# Patient Record
Sex: Female | Born: 1935 | Race: White | Hispanic: No | Smoking: Former smoker
Health system: Southern US, Community
[De-identification: ages and names within clinical notes are randomized; demographics above are authoritative.]

## PROBLEM LIST (undated history)

## (undated) DIAGNOSIS — M1990 Unspecified osteoarthritis, unspecified site: Secondary | ICD-10-CM

## (undated) DIAGNOSIS — I350 Nonrheumatic aortic (valve) stenosis: Secondary | ICD-10-CM

## (undated) DIAGNOSIS — G309 Alzheimer's disease, unspecified: Secondary | ICD-10-CM

## (undated) DIAGNOSIS — I10 Essential (primary) hypertension: Secondary | ICD-10-CM

## (undated) DIAGNOSIS — F0390 Unspecified dementia without behavioral disturbance: Secondary | ICD-10-CM

## (undated) DIAGNOSIS — E785 Hyperlipidemia, unspecified: Secondary | ICD-10-CM

## (undated) DIAGNOSIS — F0280 Dementia in other diseases classified elsewhere without behavioral disturbance: Secondary | ICD-10-CM

## (undated) DIAGNOSIS — E039 Hypothyroidism, unspecified: Secondary | ICD-10-CM

## (undated) HISTORY — DX: Nonrheumatic aortic (valve) stenosis: I35.0

## (undated) HISTORY — DX: Essential (primary) hypertension: I10

## (undated) HISTORY — PX: TOTAL ABDOMINAL HYSTERECTOMY: SHX209

## (undated) HISTORY — DX: Hypothyroidism, unspecified: E03.9

## (undated) HISTORY — DX: Unspecified osteoarthritis, unspecified site: M19.90

## (undated) HISTORY — DX: Hyperlipidemia, unspecified: E78.5

## (undated) HISTORY — PX: CYST EXCISION: SHX5701

---

## 1997-04-02 HISTORY — PX: AORTIC VALVE REPLACEMENT: SHX41

## 1999-11-16 ENCOUNTER — Encounter: Payer: Self-pay | Admitting: Internal Medicine

## 2006-07-02 ENCOUNTER — Inpatient Hospital Stay (HOSPITAL_BASED_OUTPATIENT_CLINIC_OR_DEPARTMENT_OTHER): Admission: RE | Admit: 2006-07-02 | Discharge: 2006-07-02 | Payer: Self-pay | Admitting: *Deleted

## 2006-07-08 ENCOUNTER — Ambulatory Visit: Payer: Self-pay | Admitting: Surgery

## 2006-07-09 ENCOUNTER — Ambulatory Visit: Payer: Self-pay | Admitting: Surgery

## 2006-07-19 ENCOUNTER — Encounter (INDEPENDENT_AMBULATORY_CARE_PROVIDER_SITE_OTHER): Payer: Self-pay | Admitting: Specialist

## 2006-07-19 ENCOUNTER — Ambulatory Visit: Payer: Self-pay | Admitting: Thoracic Surgery

## 2006-07-19 ENCOUNTER — Inpatient Hospital Stay (HOSPITAL_COMMUNITY): Admission: RE | Admit: 2006-07-19 | Discharge: 2006-07-24 | Payer: Self-pay | Admitting: Surgery

## 2006-08-20 ENCOUNTER — Ambulatory Visit: Payer: Self-pay | Admitting: Surgery

## 2009-05-25 ENCOUNTER — Encounter: Payer: Self-pay | Admitting: Internal Medicine

## 2009-05-25 ENCOUNTER — Encounter (INDEPENDENT_AMBULATORY_CARE_PROVIDER_SITE_OTHER): Payer: Self-pay | Admitting: *Deleted

## 2009-06-20 ENCOUNTER — Ambulatory Visit: Payer: Self-pay | Admitting: Internal Medicine

## 2009-06-20 DIAGNOSIS — K5900 Constipation, unspecified: Secondary | ICD-10-CM | POA: Insufficient documentation

## 2009-06-27 ENCOUNTER — Ambulatory Visit: Payer: Self-pay | Admitting: Internal Medicine

## 2009-07-01 ENCOUNTER — Encounter (INDEPENDENT_AMBULATORY_CARE_PROVIDER_SITE_OTHER): Payer: Self-pay | Admitting: *Deleted

## 2009-07-01 LAB — CONVERTED CEMR LAB: Fecal Occult Bld: NEGATIVE

## 2009-07-25 ENCOUNTER — Encounter (INDEPENDENT_AMBULATORY_CARE_PROVIDER_SITE_OTHER): Payer: Self-pay | Admitting: *Deleted

## 2009-07-26 ENCOUNTER — Ambulatory Visit: Payer: Self-pay | Admitting: Internal Medicine

## 2009-08-02 ENCOUNTER — Ambulatory Visit: Payer: Self-pay | Admitting: Internal Medicine

## 2009-08-05 ENCOUNTER — Encounter: Payer: Self-pay | Admitting: Internal Medicine

## 2010-05-02 NOTE — Letter (Signed)
Summary: Patient Notice- Colon Biospy Results  [facility]  [address] [place], [place] [address]   Phone: [phone]  Fax: [phone]        Aug 05, 2009 MRN: [id number]    [facility] [address] [place], [place]  [address]    Dear Ms. Alcalde,  I am pleased to inform you that the biopsies taken during your recent colonoscopy did not show any evidence of cancer or pre-cancerous changes. The suspected polyp was actually a mound of normal colon tissue.  No further action is needed at this time.  Please follow-up with      your primary care physician for your other healthcare needs.  Please call us if you are having persistent problems or have questions about your condition that have not been fully answered at this time.   Sincerely,  Iva Boop MD, [facility]   This letter has been electronically signed by your physician. cc: Buren Kos, MD  Appended Document: Patient Notice- Colon Biospy Results letter mailed 5.10.11

## 2010-05-02 NOTE — Letter (Signed)
Summary: [facility]: Immunoassay Fecal Occult Blood (iFOB) Order [facility]  [address] [place], [place] [address]   Phone: [phone]  Fax: [phone]      [facility]: Immunoassay Fecal Occult Blood (iFOB) Order Form   June 20, 2009 MRN: [id number]   [facility] [place] 01/29/1924   Physicican Name:  Leone Payor  Diagnosis Code:  V76.51      Francee Piccolo CMA [facility])

## 2010-05-02 NOTE — Procedures (Signed)
Summary: Colonoscopy  Patient: [name] Note: All result statuses are Final unless otherwise noted.  Tests: (1) Colonoscopy (COL)   COL Colonoscopy           DONE     [facility]     [address]     [place], [place]  [address]           COLONOSCOPY PROCEDURE REPORT           PATIENT:  Tara, Lyons  MR#:  [id number]     BIRTHDATE:  Feb 11, 1924, 85 yrs. old  GENDER:  female     ENDOSCOPIST:  Iva Boop, MD, [facility]     REF. BY:  Kari Baars, M.D.     PROCEDURE DATE:  08/02/2009     PROCEDURE:  Colonoscopy with biopsy     ASA CLASS:  Class II     INDICATIONS:  Routine Risk Screening     MEDICATIONS:   Fentanyl 50 mcg IV, Versed 4 mg IV           DESCRIPTION OF PROCEDURE:   After the risks benefits and     alternatives of the procedure were thoroughly explained, informed     consent was obtained.  Digital rectal exam was performed and     revealed no abnormalities.   The LB [id number]-[id number] endoscope     was introduced through the anus and advanced to the cecum, which     was identified by both the appendix and ileocecal valve, without     limitations.  The quality of the prep was good, using MoviPrep.     The instrument was then slowly withdrawn as the colon was fully     examined. insertion: 4:08 minutes withdrawal: 7:35 minutes     <<PROCEDUREIMAGES>>           FINDINGS:  A diminutive polyp was found in the cecum. The polyp     was removed using cold biopsy forceps.  Severe diverticulosis was     found in the sigmoid colon.  This was otherwise a normal     examination of the colon.   Retroflexed views in the rectum     revealed no abnormalities.    The scope was then withdrawn from     the patient and the procedure completed.           COMPLICATIONS:  None     ENDOSCOPIC IMPRESSION:     1) Diminutive polyp in the cecum - removed     2) Severe diverticulosis in the sigmoid colon     3) Otherwise normal examination           REPEAT EXAM:  In for as  needed. (At her age and these findings     would not anticipate routine repeat exams)           Iva Boop, MD, [facility]           CC:  Kari Baars, MD     The Patient           n.     eSIGNED:   Iva Boop at 08/02/2009 11:08 AM           [place], [place], [id number]  Note: An exclamation mark (!) indicates a result that was not dispersed into the flowsheet. Document Creation Date: 08/02/2009 11:09 AM _______________________________________________________________________  (1) Order result status: Final Collection or observation date-time: 08/02/2009 10:54 Requested date-time:  Receipt  date-time:  Reported date-time:  Referring Physician:   Ordering Physician: Stan Head [phone]) Specimen Source:  Source: Launa Grill Order Number: [id number] Lab site:   Appended Document: Colonoscopy: diverticulosis   Colonoscopy  Procedure date:  08/03/2009  Findings:      1) Diminutive polyp in the cecum - removed BENIGN COLONIC MUCOSA     2) Severe diverticulosis in the sigmoid colon     3) Otherwise normal examination  Comments:      No routine follow-up/recall.

## 2010-05-02 NOTE — Letter (Signed)
Summary: [facility]  [facility]   Imported By: Sherian Rein 06/28/2009 10:02:04  _____________________________________________________________________  External Attachment:    Type:   Image     Comment:   External Document

## 2010-05-02 NOTE — Letter (Signed)
Summary: [facility] Instructions  [facility]  [address] [place], [place] [address]   Phone: [phone]  Fax: [phone]       [name]    May 27, 1923    MRN: [id number]        Procedure Day /Date:  Tuesday May 3,2011     Arrival Time: 9:00 a.m.     Procedure Time: 10:00 a.m.     Location of Procedure:                    _x_  [facility] (4th Floor)                        PREPARATION FOR COLONOSCOPY WITH MOVIPREP   Starting 5 days prior to your procedure 07/29/09 do not eat nuts, seeds, popcorn, corn, beans, peas,  salads, or any raw vegetables.  Do not take any fiber supplements (e.g. Metamucil, Citrucel, and Benefiber).  THE DAY BEFORE YOUR PROCEDURE         DATE:  08/01/09  DAY: Monday  1.  Drink clear liquids the entire day-NO SOLID FOOD  2.  Do not drink anything colored red or purple.  Avoid juices with pulp.  No orange juice.  3.  Drink at least 64 oz. (8 glasses) of fluid/clear liquids during the day to prevent dehydration and help the prep work efficiently.  CLEAR LIQUIDS INCLUDE: Water Jello Ice Popsicles Tea (sugar ok, no milk/cream) Powdered fruit flavored drinks Coffee (sugar ok, no milk/cream) Gatorade Juice: apple, white grape, white cranberry  Lemonade Clear bullion, consomm, broth Carbonated beverages (any kind) Strained chicken noodle soup Hard Candy                             4.  In the morning, mix first dose of MoviPrep solution:    Empty 1 Pouch A and 1 Pouch B into the disposable container    Add lukewarm drinking water to the top line of the container. Mix to dissolve    Refrigerate (mixed solution should be used within 24 hrs)  5.  Begin drinking the prep at 5:00 p.m. The MoviPrep container is divided by 4 marks.   Every 15 minutes drink the solution down to the next mark (approximately 8 oz) until the full liter is complete.   6.  Follow completed prep with 16 oz of clear liquid of your choice  (Nothing red or purple).  Continue to drink clear liquids until bedtime.  7.  Before going to bed, mix second dose of MoviPrep solution:    Empty 1 Pouch A and 1 Pouch B into the disposable container    Add lukewarm drinking water to the top line of the container. Mix to dissolve    Refrigerate  THE DAY OF YOUR PROCEDURE      DATE:  08/02/09 DAY: Tuesday  Beginning at  5:00 a.m.(5 hours before procedure):         1. Every 15 minutes, drink the solution down to the next mark (approx 8 oz) until the full liter is complete.  2. Follow completed prep with 16 oz. of clear liquid of your choice.    3. You may drink clear liquids until 8:00 a.m.  (2 HOURS BEFORE PROCEDURE).   MEDICATION INSTRUCTIONS  Unless otherwise instructed, you should take regular prescription medications with a small sip of water   as early as  possible the morning of your procedure.           OTHER INSTRUCTIONS  You will need a responsible adult at least 76 years of age to accompany you and drive you home.   This person must remain in the waiting room during your procedure.  Wear loose fitting clothing that is easily removed.  Leave jewelry and other valuables at home.  However, you may wish to bring a book to read or  an iPod/MP3 player to listen to music as you wait for your procedure to start.  Remove all body piercing jewelry and leave at home.  Total time from sign-in until discharge is approximately 2-3 hours.  You should go home directly after your procedure and rest.  You can resume normal activities the  day after your procedure.  The day of your procedure you should not:   Drive   Make legal decisions   Operate machinery   Drink alcohol   Return to work  You will receive specific instructions about eating, activities and medications before you leave.    The above instructions have been reviewed and explained to me by   Clide Cliff, RN_______________________    I fully  understand and can verbalize these instructions _____________________________ Date _________

## 2010-05-02 NOTE — Procedures (Signed)
Summary: Colonoscopy/[facility]  Colonoscopy/[facility]   Imported By: Sherian Rein 06/28/2009 10:06:35  _____________________________________________________________________  External Attachment:    Type:   Image     Comment:   External Document

## 2010-05-02 NOTE — Letter (Signed)
Summary: Previsit letter  [facility]  [address] [place], [place] [address]   Phone: [phone]  Fax: [phone]       07/01/2009 MRN: [id number]  [facility] [address] [place], [place]  [address]  Dear Ms. Hipwell,  Welcome to the Gastroenterology Division at [facility].    You are scheduled to see a nurse for your pre-procedure visit on  07-26-09 at 11am,  on the 3rd floor at [facility], [address]  We ask that you try to arrive at our office 15 minutes prior to your appointment time to allow for check-in.  Your nurse visit will consist of discussing your medical and surgical history, your immediate family medical history, and your medications.    Please bring a complete list of all your medications or, if you prefer, bring the medication bottles and we will list them.  We will need to be aware of both prescribed and over the counter drugs.  We will need to know exact dosage information as well.  If you are on blood thinners (Coumadin, Plavix, Aggrenox, Ticlid, etc.) please call our office today/prior to your appointment, as we need to consult with your physician about holding your medication.   Please be prepared to read and sign documents such as consent forms, a financial agreement, and acknowledgement forms.  If necessary, and with your consent, a friend or relative is welcome to sit-in on the nurse visit with you.  Please bring your insurance card so that we may make a copy of it.  If your insurance requires a referral to see a specialist, please bring your referral form from your primary care physician.  No co-pay is required for this nurse visit.     If you cannot keep your appointment, please call [phone] to cancel or reschedule prior to your appointment date.  This allows [place] the opportunity to schedule an appointment for another patient in need of care.    Thank you for choosing [facility] for your medical needs.   We appreciate the opportunity to care for you.  Please visit [place] at our website  to learn more about our practice.                     Sincerely.                                                                                                                   The Gastroenterology Division   (Your Colonoscopy is on 08-02-09 at 10am )  Appended Document: Previsit letter Letter mailed to patient.

## 2010-05-02 NOTE — Letter (Signed)
Summary: New Patient letter  [facility]  [address] [place], [place] [address]   Phone: [phone]  Fax: [phone]       05/25/2009 MRN: [id number]  [facility] [address] [place], [place]  [address]  Dear [name],  Welcome to the Gastroenterology Division at [facility].    You are scheduled to see Dr. Stan Head on June 20, 2009 at 10:30am on the 3rd floor at [facility], [address]  We ask that you try to arrive at our office 15 minutes prior to your appointment time to allow for check-in.  We would like you to complete the enclosed self-administered evaluation form prior to your visit and bring it with you on the day of your appointment.  We will review it with you.  Also, please bring a complete list of all your medications or, if you prefer, bring the medication bottles and we will list them.  Please bring your insurance card so that we may make a copy of it.  If your insurance requires a referral to see a specialist, please bring your referral form from your primary care physician.  Co-payments are due at the time of your visit and may be paid by cash, check or credit card.     Your office visit will consist of a consult with your physician (includes a physical exam), any laboratory testing he/she may order, scheduling of any necessary diagnostic testing (e.g. x-ray, ultrasound, CT-scan), and scheduling of a procedure (e.g. Endoscopy, Colonoscopy) if required.  Please allow enough time on your schedule to allow for any/all of these possibilities.    If you cannot keep your appointment, please call [phone] to cancel or reschedule prior to your appointment date.  This allows [place] the opportunity to schedule an appointment for another patient in need of care.  If you do not cancel or reschedule by 5 p.m. the business day prior to your appointment date, you will be charged a $50.00 late cancellation/no-show fee.    Thank you for  choosing [facility] for your medical needs.  We appreciate the opportunity to care for you.  Please visit [place] at our website  to learn more about our practice.                     Sincerely,                                                             The Gastroenterology Division

## 2010-05-02 NOTE — Miscellaneous (Signed)
Summary: COLON LEC 08-02-09 10AM  HX.POLYPS,CONSTIPATION         DR.GESS...  Clinical Lists Changes  Medications: Added new medication of MOVIPREP 100 GM  SOLR (PEG-KCL-NACL-NASULF-NA ASC-C) As directed - Signed Rx of MOVIPREP 100 GM  SOLR (PEG-KCL-NACL-NASULF-NA ASC-C) As directed;  #1 x 0;  Signed;  Entered by: Clide Cliff RN;  Authorized by: Iva Boop MD, FACG;  Method used: [facility] to [facility]  [address] [place], [place]  [address], Ph: [phone] or [phone], Fax: [phone] Observations: Added new observation of ALLERGY REV: Done (07/26/2009 10:46)    Prescriptions: MOVIPREP 100 GM  SOLR (PEG-KCL-NACL-NASULF-NA ASC-C) As directed  #1 x 0   Entered by:   Clide Cliff RN   Authorized by:   Iva Boop MD, [facility]   Signed by:   Clide Cliff RN on 07/26/2009   Method used:   [facility]        [facility]  [address] (retail)       [address]       [place], [place]  [address]       Ph: [phone] or [phone]       Fax: [phone]   RxID:   [id number]

## 2010-05-02 NOTE — Assessment & Plan Note (Signed)
Summary: screening, chronic constipation   History of Present Illness Visit Type: new patient Primary GI MD: Stan Head MD [facility] Primary Provider: Buren Kos, MD Chief Complaint: colon cancer screening, constipation History of Present Illness:   Last colonoscopy in [place] > 10 yrs  "I got too much radiation after hysterectomy" Bladder not right and had urinary leakage. Detrrol helps but she thinks it constipates her. she had hard balls of stool, small x yrs.  Now large and soft on prunes. Defecates every otherday.She thinks the prunes have helped. Milk of agnesia, other laxatives dod not help as well. She has been on Detrol x yrs,     GI Review of Systems    Reports acid reflux and  bloating.      Denies abdominal pain, belching, chest pain, dysphagia with liquids, dysphagia with solids, heartburn, loss of appetite, nausea, vomiting, vomiting blood, weight loss, and  weight gain.      Reports constipation and  hemorrhoids.     Denies anal fissure, black tarry stools, change in bowel habit, diarrhea, diverticulosis, fecal incontinence, heme positive stool, irritable bowel syndrome, jaundice, light color stool, liver problems, rectal bleeding, and  rectal pain. Colonoscopy  Procedure date:  06/16/1999  Findings:      Left-sided diverticulosis rectal polyp- hyperplastic hemorrhoids  random colon biopsies (was having diarrhea): mild chronic non-specific colitis   Preventive Screening-Counseling & Management      Drug Use:  no.      Current Medications (verified): 1)  Metoprolol Tartrate 50 Mg Tabs (Metoprolol Tartrate) .... Take 1 Tablet By Mouth Two Times A Day 2)  Synthroid 112 Mcg Tabs (Levothyroxine Sodium) .... Take 1 Tablet By Mouth Once A Day 3)  Aspirin 81 Mg Tabs (Aspirin) .... Take 1 Tablet By Mouth Once A Day 4)  Tylenol 325 Mg Tabs (Acetaminophen) .... Take 1-2 Tabs By Mouth As Needed 5)  Fluocinonide 0.05 % Crea (Fluocinonide) .... As Needed 6)  Benadryl 25  Mg Tabs (Diphenhydramine Hcl) .... As Needed 7)  Detrol La 4 Mg Xr24h-Cap (Tolterodine Tartrate) .... Take 1 Capsule By Mouth Once A Day 8)  Icaps Mv  Tabs (Multiple Vitamins-Minerals) .... Take 1 Capsule By Mouth Once A Day  Allergies (verified): No Known Drug Allergies  Past History:  Past Medical History: Reviewed history from 06/16/2009 and no changes required. Osteopenia Restless Leg Syndrome Endometrial Cancer Arthritis Diverticulitis Hyperlipidemia Hypertension Hypothyroidism Impaired Fasting Glucose  Past Surgical History: Breast Biopsy x 2 BSO Hysterectomy - Endometrial Cancer  Heart Valve Surgery Aortic Prosthesis - tissue (? porcine) 2008.Marland KitchenMarland KitchenBartle  Family History: Family History of Liver Cancer: Half-Brother Family History of Heart Disease: Mother, Father No FH of Colon Cancer:  Social History: Reviewed history from 06/16/2009 and no changes required. married, 2 children Retired Print production planner Patient is a former smoker.  Alcohol Use - yes Illicit Drug Use - no Drug Use:  no  Review of Systems       The patient complains of allergy/sinus and skin rash.         Also ilateral hip pains, bursitis, injections by Allusio Rash is gone Muscle aches, increased ESR and ? of PMR All other ROS negative except as per HPI.   Vital Signs:  Patient profile:   77 year old female Height:      60 inches Weight:      160 pounds Pulse rate:   72 / minute Pulse rhythm:   regular BP sitting:   110 / 58  (  left arm)  Vitals Entered By: Milford Cage NCMA (June 20, 2009 10:44 AM)  Physical Exam  General:  Well developed, well nourished, no acute distress. Eyes:  PERRLA, no icterus. Mouth:  No deformity or lesions,dentures. Neck:  Supple; no masses or thyromegaly. Lungs:  Clear throughout to auscultation. Heart:  Regular rate and rhythm; no murmurs, rubs,  or bruits. Abdomen:  obese, soft, non-tender without HSM/mass BS+ Rectal:  deferred until time of  colonoscopy.   Msk:  walks slow and needs help getting up on exam table Extremities:  trace edema Cervical Nodes:  No significant cervical or supraclavicular adenopathy.  Psych:  Alert and cooperative. Normal mood and affect.  Labs reviewed and scanned 05/25/20 ESR 53 PLT 131, Hgb 13.7. WBC 6.6 Ferritin 144  Impression & Recommendations:  Problem # 1:  SCREENING COLORECTAL-CANCER (ICD-V76.51) Assessment New start with iFOBT some concern over hysterectomy and XRT and increased risk of cdolonoscopy she is > 80 also we discussed options and will start here, still considering colonoscopy if + colonoscopy if not re discuss  Problem # 2:  CONSTIPATION (ICD-564.00) Assessment: New sounds chronic and drug-induced  Patient Instructions: 1)  Please go to the basement to have your labs. 2)  We will call you with results when they are available. 3)  Copy sent to : Buren Kos, MD 4)  The medication list was reviewed and reconciled.  All changed / newly prescribed medications were explained.  A complete medication list was provided to the patient / caregiver.

## 2010-05-26 ENCOUNTER — Other Ambulatory Visit: Payer: Self-pay | Admitting: Internal Medicine

## 2010-05-26 DIAGNOSIS — Z1231 Encounter for screening mammogram for malignant neoplasm of breast: Secondary | ICD-10-CM

## 2010-06-07 ENCOUNTER — Ambulatory Visit
Admission: RE | Admit: 2010-06-07 | Discharge: 2010-06-07 | Disposition: A | Discharge status: Home / Self Care | Payer: Medicare Other | Source: Ambulatory Visit | Attending: Internal Medicine | Admitting: Internal Medicine

## 2010-06-07 DIAGNOSIS — Z1231 Encounter for screening mammogram for malignant neoplasm of breast: Secondary | ICD-10-CM

## 2010-06-14 ENCOUNTER — Other Ambulatory Visit: Payer: Self-pay | Admitting: Internal Medicine

## 2010-06-14 DIAGNOSIS — R928 Other abnormal and inconclusive findings on diagnostic imaging of breast: Secondary | ICD-10-CM

## 2010-06-20 ENCOUNTER — Encounter: Payer: Self-pay | Admitting: *Deleted

## 2010-06-20 DIAGNOSIS — E039 Hypothyroidism, unspecified: Secondary | ICD-10-CM | POA: Insufficient documentation

## 2010-06-20 DIAGNOSIS — I350 Nonrheumatic aortic (valve) stenosis: Secondary | ICD-10-CM | POA: Insufficient documentation

## 2010-06-20 DIAGNOSIS — I10 Essential (primary) hypertension: Secondary | ICD-10-CM | POA: Insufficient documentation

## 2010-06-20 DIAGNOSIS — E785 Hyperlipidemia, unspecified: Secondary | ICD-10-CM | POA: Insufficient documentation

## 2010-06-21 ENCOUNTER — Ambulatory Visit
Admission: RE | Admit: 2010-06-21 | Discharge: 2010-06-21 | Disposition: A | Discharge status: Home / Self Care | Payer: Medicare Other | Source: Ambulatory Visit | Attending: Internal Medicine | Admitting: Internal Medicine

## 2010-06-21 ENCOUNTER — Ambulatory Visit
Admission: RE | Admit: 2010-06-21 | Discharge: 2010-06-21 | Disposition: A | Discharge status: Home / Self Care | Payer: BLUE CROSS/BLUE SHIELD | Source: Ambulatory Visit | Attending: Internal Medicine | Admitting: Internal Medicine

## 2010-06-21 DIAGNOSIS — R928 Other abnormal and inconclusive findings on diagnostic imaging of breast: Secondary | ICD-10-CM

## 2010-06-26 ENCOUNTER — Ambulatory Visit: Payer: Self-pay | Admitting: Cardiology

## 2010-07-19 ENCOUNTER — Ambulatory Visit (INDEPENDENT_AMBULATORY_CARE_PROVIDER_SITE_OTHER): Payer: Medicare Other | Admitting: Cardiology

## 2010-07-19 ENCOUNTER — Encounter: Payer: Self-pay | Admitting: Cardiology

## 2010-07-19 VITALS — BP 132/78 | HR 60 | Ht 62.0 in | Wt 155.0 lb

## 2010-07-19 DIAGNOSIS — I359 Nonrheumatic aortic valve disorder, unspecified: Secondary | ICD-10-CM

## 2010-07-19 DIAGNOSIS — E785 Hyperlipidemia, unspecified: Secondary | ICD-10-CM

## 2010-07-19 DIAGNOSIS — I350 Nonrheumatic aortic (valve) stenosis: Secondary | ICD-10-CM

## 2010-07-19 DIAGNOSIS — I10 Essential (primary) hypertension: Secondary | ICD-10-CM

## 2010-07-19 DIAGNOSIS — Z952 Presence of prosthetic heart valve: Secondary | ICD-10-CM

## 2010-07-19 DIAGNOSIS — Z954 Presence of other heart-valve replacement: Secondary | ICD-10-CM

## 2010-07-19 NOTE — Assessment & Plan Note (Signed)
Blood pressure is well controlled. We will continue with metoprolol.

## 2010-07-19 NOTE — Assessment & Plan Note (Signed)
The patient is asymptomatic. Her last echocardiogram in March of 2003 and showed normal valve function. She will continue with aspirin therapy. I have recommended routine SBE prophylaxis.

## 2010-07-19 NOTE — Assessment & Plan Note (Signed)
She is currently on no lipid-lowering therapy. Her lab work is followed routinely by Dr. Clelia Croft.

## 2010-07-19 NOTE — Patient Instructions (Signed)
Continue Current medications.  Take antibiotics if you have dental work.  We will see you back again in 1 year.

## 2010-07-19 NOTE — Progress Notes (Signed)
   [name] Date of Birth: 03/14/1924   History of Present Illness: [name] is seen for followup today. She has done very well this past year. She denies any chest pain, shortness of breath, or dizziness. She has stumbled and fallen several times. She denies any true syncope. Overall she feels that her health is good.  Current Outpatient Prescriptions on File Prior to Visit  Medication Sig Dispense Refill  . aspirin 81 MG tablet Take 81 mg by mouth daily.        . calcium-vitamin D (OSCAL) 250-125 MG-UNIT per tablet Take 1 tablet by mouth daily.  30 tablet    . DiphenhydrAMINE HCl (BENADRYL PO) Take by mouth as needed.        Marland Kitchen FLUOCINONIDE EX Apply topically as needed.        Marland Kitchen levothyroxine (SYNTHROID, LEVOTHROID) 112 MCG tablet Take 125 mcg by mouth daily.       . metoprolol (LOPRESSOR) 50 MG tablet Take 50 mg by mouth 2 (two) times daily.       . Multiple Vitamins-Minerals (ICAPS PO) Take by mouth daily.        . rosuvastatin (CRESTOR) 10 MG tablet Take 1 tablet (10 mg total) by mouth at bedtime.  30 tablet    . solifenacin (VESICARE) 5 MG tablet Take 5 mg by mouth daily.   60 tablet    . Multiple Vitamin (MULTIVITAMIN) tablet Take 1 tablet by mouth daily.        Marland Kitchen tolterodine (DETROL LA) 4 MG 24 hr capsule Take 4 mg by mouth daily.          No Known Allergies  Past Medical History  Diagnosis Date  . Severe aortic stenosis   . HTN (hypertension)   . Dyslipidemia   . Hypothyroidism   . Arthritis     Past Surgical History  Procedure Date  . Aortic valve replacement 1999  . Total abdominal hysterectomy     Uterine cancer    History  Smoking status  . Former Smoker  . Types: Cigarettes  . Quit date: 06/20/1995  Smokeless tobacco  . Not on file    History  Alcohol Use  . 4.2 oz/week  . 7 Shots of liquor per week    Family History  Problem Relation Age of Onset  . Heart disease Father   . Heart disease Mother     Review of Systems: The review of  systems is positive for occasional heartburn for which she takes [place]. She has occasional headaches.  All other systems were reviewed and are negative.  Physical Exam: BP 132/78  Pulse 60  Ht 5\' 2"  (1.575 m)  Wt 155 lb (70.308 kg)  BMI 28.35 kg/m2 She is an elderly, pleasant white female in no distress. HEENT exam is unremarkable. Carotid upstrokes are normal without bruit. She has no JVD, adenopathy, or thyromegaly. Lungs are clear. Cardiac exam reveals a regular rate and rhythm with a soft grade 1/6 systolic ejection murmur at the right upper sternal border. There is no gallop. Abdomen is soft and nontender. She has no edema. Pedal pulses are good. She is alert and oriented x3. Cranial nerves II through XII are intact.  LABORATORY DATA: ECG today demonstrates sinus bradycardia with a rate of 56 beats per minute. She has a first degree AV block. Otherwise normal.  Assessment / Plan:

## 2010-07-20 ENCOUNTER — Encounter: Payer: Self-pay | Admitting: Cardiology

## 2010-08-18 NOTE — Discharge Summary (Signed)
[name], [name]               ACCOUNT NO.:  [id number]   MEDICAL RECORD NO.:  [id number]          PATIENT TYPE:  INP   LOCATION:  2031                         FACILITY:  [facility]   PHYSICIAN:  Evelene Croon, M.D.     DATE OF BIRTH:  1924/02/23   DATE OF ADMISSION:  07/19/2006  DATE OF DISCHARGE:  07/23/2006                               DISCHARGE SUMMARY   PRIMARY ADMITTING DIAGNOSIS:  Critical aortic stenosis.   ADDITIONAL/DISCHARGE DIAGNOSES:  1. Critical aortic stenosis.  2. History of uterine cancer status post hysterectomy and pelvic      radiation.  3. Osteoarthritis.  4. Osteoporosis.  5. Remote history of tobacco abuse.  6. Mild postoperative delirium, resolved.   PROCEDURES PERFORMED:  Aortic valve replacement with 19-mm Edwards  pericardial magna valve.   HISTORY:  The patient is an 77 year old female who has been followed for  the past several years in [place], [place] for critical aortic  stenosis.  She recently moved back to [facility] and followed up with  Dr. Reyes Lyons.  She presented with at least a 53-month history of increasing  dyspnea on exertion as well as some substernal chest pain.  An  echocardiogram done on June 26, 2006 showed normal left ventricular  size and function with an ejection fraction of 60-65%.  There was  moderate left ventricular hypertrophy.  There was normal right  ventricular size and function.  The aortic valve was calcified with  severe aortic stenosis with a valve area of 0.6 cm2 and a mean gradient  of 60 mmHg.  She subsequently underwent cardiac catheterization on July 02, 2006 which showed no significant coronary artery disease.  After  review of her catheterization it was felt that aortic valve replacement  was indicated and she was referred to Dr. Evelene Croon for surgical  consideration.  Based on her symptoms Dr. Laneta Simmers agreed that her best  course of action would be to proceed with aortic valve replacement with  a  tissue valve based on her age.  He explained the risks, benefits and  alternatives of surgery to the patient and she agreed to proceed.   HOSPITAL COURSE:  She was admitted to [facility] on July 19, 2006 and underwent aortic valve replacement as described in detail above  performed by Dr. Laneta Simmers.  She tolerated the procedure well and was  transferred to the SICU in stable condition.  Postoperatively she has  done very well.  She had some mild delirium while in the ICU which  resolved with minimization of narcotics and conservative management.  By  postop day #2 she was ready for transfer to the floor.  She has been  somewhat volume overloaded and has been diuresed back down to within 1  kg of her preoperative weight.  She is ambulating in the halls with  cardiac rehab phase one and is making good progress.  She has remained  afebrile and all vital signs have been stable.  Her incisions are all  healing well.  Her labs on postop day #4 showed hemoglobin of 12.8,  hematocrit  38.2, white count 9.6, platelets 119, sodium 142, potassium  4.4, BUN 21, creatinine 1.13.  She is tolerating a regular diet and is  having normal bowel and bladder function.  It is felt that if she  continues to remain stable over the next 24 hours she will hopefully be  ready for discharge home potentially by July 24, 2006.   DISCHARGE MEDICATIONS:  1. Enteric-coated aspirin 3-25 mg daily.  2. Toprol XL 25 mg daily.  3. Crestor 10 mg daily.  4. Lasix 40 mg daily x3 days.  5. K-Dur 20 mEq daily x3 days.  6. Synthroid 125 mcg daily.  7. Actonel 35 mg weekly.  8. Calcium 600 mg b.i.d.  9. Vitamin D 1 tablet b.i.d.  10.Detrol LA one tab daily..  11.ICAPS daily.  12.Tylenol 1-2 q.4-6h. p.r.n. for pain.   DISCHARGE INSTRUCTIONS:  She is asked to refrain from driving, heavy  lifting or strenuous activity.  She may continue ambulating daily and  using her incentive spirometer.  She may shower daily and  clean her  incisions with soap and water.  She will continue her same preoperative  diet.   DISCHARGE FOLLOWUP:  She will see Dr. Laneta Simmers back in the office in 3  weeks and will be contacted with this appointment.  She is asked to make  an appointment see Dr. Reyes Lyons in 2 weeks and have a chest x-ray at that  visit.  If she experiences any problems or has questions in the interim  she is asked to contact our office immediately.      Coral Ceo, P.A.      Evelene Croon, M.D.  Electronically Signed    GC/MEDQ  D:  07/23/2006  T:  07/23/2006  Job:  [id number]   cc:   Elmore Guise., M.D.

## 2010-08-18 NOTE — Op Note (Signed)
[name], [name]               ACCOUNT NO.:  [id number]   MEDICAL RECORD NO.:  [id number]          PATIENT TYPE:  INP   LOCATION:  2309                         FACILITY:  [facility]   PHYSICIAN:  Evelene Croon, M.D.     DATE OF BIRTH:  10/05/1923   DATE OF PROCEDURE:  07/19/2006  DATE OF DISCHARGE:                               OPERATIVE REPORT   PREOPERATIVE DIAGNOSIS:  Critical aortic stenosis.   POSTOPERATIVE DIAGNOSIS:  Critical aortic stenosis.   OPERATIVE PROCEDURE:  Median sternotomy, extracorporeal circulation,  aortic valve replacement using a 19-mm Edwards Pericardial Magna valve.   ATTENDING SURGEON:  Evelene Croon, M.D.   ASSISTANT:  Rowe Clack, P.A.-C.   ANESTHESIA:  General endotracheal.   CLINICAL HISTORY:  This patient is an 77 year old woman referred by Dr.  Lady Deutscher with critical aortic stenosis.  She presented with at least  a 25-month history of increasing dyspnea with exertion, as well as some  substernal chest pain.  An echocardiogram on 06/26/2006 showed normal  left ventricular size and function with an ejection fraction of 60 to  65%.  There was moderate left ventricular hypertrophy.  There was normal  right ventricular size and function.  The aortic valve was calcified  with severe aortic stenosis.  The valve area was 0.6 cm sq, with a mean  gradient of 60 mmHg.  There was trivial aortic insufficiency.  There was  trivial mitral regurgitation and trivial tricuspid regurgitation.  She  subsequently underwent cardiac catheterization on 07/02/2006, which showed  no significant coronary disease.  The left ventricular end-diastolic  pressure was 20.  The peak gradient across the valve was documented as  15 mmHg, with a mean gradient of 38 mmHg, with a valve area of 0.47 cm  sq.  After review of the cardiac catheterization and echocardiogram and  examination of the patient, it was felt that aortic valve replacement  using a tissue valve would be the best  treatment to prevent further  complications of critical aortic stenosis.  I discussed the operative  procedure with the patient and her family, including use of a tissue  valve, since she is 77 years old.  We discussed alternatives, benefits  and risks, including but not limited to bleeding, blood transfusion,  infection, stroke, myocardial infarction, organ failure and death.  She  understood and agreed to proceed.   DESCRIPTION OF OPERATIVE PROCEDURE:  The patient was taken to the  operating room and placed on the table in a supine position.  After  induction of general endotracheal anesthesia, a Foley catheter was  placed in the bladder using sterile technique.  Then, the chest, abdomen  and both lower extremities were prepped and draped in the usual sterile  fashion.  A transesophageal echocardiogram was performed by  anesthesiology.  This showed critical calcific aortic stenosis.  The  mitral valve had very mild regurgitation.  Left ventricular function was  well preserved.   Then, the chest was opened through a median sternotomy incision.  The  pericardium was opened in the midline. Examination of the heart showed  good ventricular  contractility.  The ascending aorta had no palpable  plaques in it.  The aorta was relatively small.  Her BSA was 1.7.  Then,  the patient was heparinized, and when an adequate activated clotting  time was achieved, the distal ascending aorta was cannulated using a 20-  [place] aortic cannula for arterial inflow.  Venous outflow was achieved  using a 2-stage venous cannula to the right atrial appendage.  An  antegrade cardioplegia and vent cannula was inserted in the aortic root.  A left ventricular vent was placed through the right superior pulmonary  vein.  I inserted a retrograde cardioplegia cannula through the right  atrium and the coronary sinus.  This cannulated the coronary sinus  without any difficulty, but I could not get blood return through  the  catheter.  When I examined the area of the coronary sinus, it was noted  that the catheter actually perforated through the coronary sinus.  Therefore, this was completely withdrawn from the coronary sinus.  The  patient was then placed on cardiopulmonary bypass.  While on bypass, the  coronary sinus laceration was repaired using continuous 5-0 Prolene  suture in 2 layers.  This closure was reinforced with light coating of  BioGlue.  This resulted in complete hemostasis.   Then, the aorta was crossclamped and 1000 mL of cold-blood antegrade  cardioplegia was administered in the aortic root with a quick arrest of  the heart.  Systemic hypothermia to 28 degree and topical hypothermic  iced saline was used.  A temperature probe was placed on the septum and  an insulating pad on the pericardium.   The aorta was then opened transversely at the level of the sinotubular  junction.  Examination of the native valve showed that there were 3  leaflets.  There was marked calcification and immobility.  The leaflets  were excised.  The annulus was also calcified and was therefore  decalcified using rongeurs.  Care was taken to remove all particulate  debris.  The left coronary artery was lying up off aortic annulus, but  was heavily calcified around the ostium.  There was no evidence of  stenosis.  The right coronary ostium had no calcification, but was lying  very close to the aortic annulus and adjacent to the commissure between  the right and noncoronary cusp.  The left ventricle was irrigated with  iced saline solution.  Care was taken to remove all particulate debris.  The annulus was sized and a 19-mm pericardial valve was the largest  stented valve that would fit.  I did not feel total hip arthroplasty a  stentless valve would be suitable, since there was heavy calcification  around the left coronary ostium, and the right coronary ostium was very close to the annulus and the commissure.  I  felt this would make  performing the distal suture line very risky.  Then, a series of 2-0  [place] horizontal mattress sutures were placed around the annulus with  pledgets in the subannular position.  The sutures were placed in the  sewing ring and the valve lowered in place.  The suture was tied  sequentially.  The valve seated nicely.  The coronary ostia were not  obstructed.  The patient was then given another dose of antegrade  cardioplegia directly and into the coronary ostia.  The patient was  rewarmed to 37 degrees Centigrade.  The aorta was closed in 2 layers  using continuous 4-0 Prolene suture.  The left side of  the heart was  then de-aired and the head placed in Trendelenburg position.  The  crossclamp was removed with a time of 93 minutes and the patient  spontaneously converted to sinus rhythm.  Two temporary right  ventricular and right atrial pacing wires were placed and brought out  through the skin.   When the patient was rewarmed to 37 degrees Centigrade, she was weaned  from cardiopulmonary bypass on no inotropic agents.  Total bypass time  was 117 minutes.  Cardiac function appeared excellent.  Cardiac output  was 4 L per minute.  Transesophageal echocardiogram showed a normal  functioning aortic valve prosthesis without evidence of perivalvular  leak or regurgitation.  There was trace mitral regurgitation, which was  unchanged.  Left ventricular function was well preserved.  Protamine was  then given.  The patient was given 2 units of plates due to a platelet  count of 60,000.  After obtaining hemostasis, 2 chest tubes were placed  with 2 in the posterior pericardium and 1 in the anterior mediastinum.  The sternum was closed with #6 stainless steel wires.  The fascia was  closed with continuous #1 Vicryl suture.  The subcutaneous tissue was  closed with continuous 2-0 Vicryl, and the skin with 3-0  Vicryl subcuticular closure.  The sponge, needle and instrument  counts  were correct according to the scrub nurse.  A dry sterile dressing was  applied over the incision and around the chest tubes were hooked to  Pleur-evac suction.  The patient remained hemodynamically stable and was  transported to the SICU in guarded, but stable condition.      Evelene Croon, M.D.  Electronically Signed     BB/MEDQ  D:  07/19/2006  T:  07/20/2006  Job:  [id number]   cc:   Elmore Guise., M.D.

## 2010-08-18 NOTE — Op Note (Signed)
NAMEJOSSILYN, [name]               ACCOUNT NO.:  [id number]   MEDICAL RECORD NO.:  [id number]          PATIENT TYPE:  INP   LOCATION:  2309                         FACILITY:  [facility]   PHYSICIAN:  Bedelia Person, M.D.        DATE OF BIRTH:  1924-03-04   DATE OF PROCEDURE:  07/19/2006  DATE OF DISCHARGE:                               OPERATIVE REPORT   ANESTHESIOLOGIST:  Bedelia Person, M.D.   PROCEDURE:  Intraoperative transesophageal echocardiography.   INDICATIONS FOR PROCEDURE:  Miss Daigneault has no history of medical  problems to her esophagus or stomach that would contradict using the  transesophageal probe intraoperatively.   DESCRIPTION OF PROCEDURE:  After induction with general anesthesia, the  airway was secured with an oroendotracheal tube.  The transesophageal  probe was heavily lubricated and placed in a sleeve, which was  lubricated, and passed down the oropharynx to obtain various OmniPlane  views throughout the procedure.  At the completion of the case, the  probe was removed.  There was no evidence of oral or pharyngeal damage.   PREBYPASS EXAMINATION:  Revealed the left ventricle to be slightly  thickened.  Contractility was overall good.  There was no segmental  defect detected.  The left atrium was slightly enlarged.  The septum was  intact.  The appendage was clean.  The mitral valve had 2 leaflets that  opened and closed appropriately.  No calcifications were noted.  Color  Doppler revealed a 1 to 2+, thin, central regurgitant flow.  PISA  studies done on this flow documented that the reversal of flow was  insignificant.  Pulmonary vein Dopplers also revealed no reversal of  flow.  The aortic valve had what appeared to be leaflets, although  calcification was so heavy, it was difficult to discern.  Annular  calcification was extensive.  The diameter was measured at 1.9 cm.  There was trace aortic insufficiency.  The tricuspid valve had 2  leaflets.  There was mild  tricuspid insufficiency.  It should be noted  that the Swan-Ganz catheter was across the valve at this time.  Right  heart exam was otherwise unremarkable.  The patient was placed on  cardiopulmonary bypass and underwent prosthetic replacement of the  aortic valve.  At the completion of the procedure on termination of  bypass, there were numerous air bubbles, which were cleared with the  left ventricular vent.  Left ventricular function remained good.  No  segmental defects were detected.  The mitral valve was unchanged in  appearance and function.  The prosthetic aortic valve appeared well  seated.  There were no perivalvular leaks detected.  The leaflets could  be noted to be opening and closing appropriately.  The right heart exam  remained as prebypass examination.           ______________________________  Bedelia Person, M.D.     LK/MEDQ  D:  07/19/2006  T:  07/20/2006  Job:  [id number]

## 2010-08-18 NOTE — Cardiovascular Report (Signed)
[name], MAHADEO               ACCOUNT NO.:  [id number]   MEDICAL RECORD NO.:  [id number]          PATIENT TYPE:  OIB   LOCATION:  1961                         FACILITY:  [facility]   PHYSICIAN:  Elmore Guise., M.D.DATE OF BIRTH:  [birth year]   DATE OF PROCEDURE:  07/02/2006  DATE OF DISCHARGE:                            CARDIAC CATHETERIZATION   PROCEDURE:  Right and left heart catheterization.   INDICATIONS FOR PROCEDURE:  Symptomatic aortic stenosis.   HISTORY OF PRESENT ILLNESS:  [name] is a very pleasant 77 year old  white female with past medical history of hypertension and dyslipidemia  who now presents with symptomatic aortic stenosis.  She had been  followed in the clinic the last couple of years with serial  echocardiograms; however, now she started to have exertional angina and  dyspnea and is referred for right and left heart catheterization.   DESCRIPTION OF PROCEDURE:  The patient was brought to the cardiac cath  lab after appropriate informed consent.  She was prepped and draped in  sterile fashion.  Approximately 20 mL of 1% lidocaine was used for local  anesthesia.  A 4-French sheath was placed in the right femoral artery  and a 7-French sheath in the right femoral vein.  Right heart  catheterization was performed followed by coronary angiography, LV  angiography and left heart catheterization.  The patient tolerated the  procedure well and was transferred from the cardiac cath lab in stable  condition.   FINDINGS:  Right heart catheterization measurements.  1. RA:  9 mmHg.  2. RV:  38/4 mmHg.  3. PA:  27/18 mmHg.  4. PCWP:  12 mmHg.  5. Cardiac output 3.5 liters per minute.   Coronaries:  1. Left main:  Mild proximal calcification but no obstructive disease.  2. LAD:  Mild proximal calcification with diffuse mild luminal      irregularities.  D1 is a small size vessel with nonobstructive      disease.  3. LCX: Codominant, mild luminal  irregularities.  4. OM-1 and OM-2:  Are moderate size with mild luminal irregularities.  5. RCA: Codominant with mild luminal irregularities.  6. LV:  EF is 60%.  No wall motion abnormalities.  LV pressures are      218/20 with an LVEDP of 20 mmHg.  Aortic pressures are 168/83 on      pullback with a peak of 15 mmHg and a mean of 38 mmHg, giving an      aortic valve area of 0.47 cm2.  Aortic valve area is calcified on      fluoroscopy.   IMPRESSION:  1. Nonobstructive coronaries.  2. Severe/critical aortic stenosis (aortic valve area of 0.4 cm2 which      is consistent with her recent echocardiogram data).  3. Borderline pulmonary hypertension.  4. Systemic hypertension.   PLAN:  1. The patient will be referred for aortic valve replacement.  She is      very healthy and active.  2. She will have continued SBE prophylaxis.      Elmore Guise., M.D.  Electronically Signed  TWK/MEDQ  D:  07/02/2006  T:  07/02/2006  Job:  [id number]   cc:   Kari Baars, M.D.

## 2011-11-28 DIAGNOSIS — L821 Other seborrheic keratosis: Secondary | ICD-10-CM | POA: Diagnosis not present

## 2011-11-28 DIAGNOSIS — L259 Unspecified contact dermatitis, unspecified cause: Secondary | ICD-10-CM | POA: Diagnosis not present

## 2011-11-28 DIAGNOSIS — D239 Other benign neoplasm of skin, unspecified: Secondary | ICD-10-CM | POA: Diagnosis not present

## 2011-11-28 DIAGNOSIS — L2089 Other atopic dermatitis: Secondary | ICD-10-CM | POA: Diagnosis not present

## 2011-12-26 DIAGNOSIS — Z23 Encounter for immunization: Secondary | ICD-10-CM | POA: Diagnosis not present

## 2012-01-10 DIAGNOSIS — Z961 Presence of intraocular lens: Secondary | ICD-10-CM | POA: Diagnosis not present

## 2012-01-10 DIAGNOSIS — H2510 Age-related nuclear cataract, unspecified eye: Secondary | ICD-10-CM | POA: Diagnosis not present

## 2012-01-31 DIAGNOSIS — R82998 Other abnormal findings in urine: Secondary | ICD-10-CM | POA: Diagnosis not present

## 2012-01-31 DIAGNOSIS — I10 Essential (primary) hypertension: Secondary | ICD-10-CM | POA: Diagnosis not present

## 2012-01-31 DIAGNOSIS — M899 Disorder of bone, unspecified: Secondary | ICD-10-CM | POA: Diagnosis not present

## 2012-01-31 DIAGNOSIS — E785 Hyperlipidemia, unspecified: Secondary | ICD-10-CM | POA: Diagnosis not present

## 2012-01-31 DIAGNOSIS — M949 Disorder of cartilage, unspecified: Secondary | ICD-10-CM | POA: Diagnosis not present

## 2012-01-31 DIAGNOSIS — E039 Hypothyroidism, unspecified: Secondary | ICD-10-CM | POA: Diagnosis not present

## 2012-01-31 DIAGNOSIS — R7301 Impaired fasting glucose: Secondary | ICD-10-CM | POA: Diagnosis not present

## 2012-02-04 DIAGNOSIS — E039 Hypothyroidism, unspecified: Secondary | ICD-10-CM | POA: Diagnosis not present

## 2012-02-04 DIAGNOSIS — I10 Essential (primary) hypertension: Secondary | ICD-10-CM | POA: Diagnosis not present

## 2012-02-04 DIAGNOSIS — Z1331 Encounter for screening for depression: Secondary | ICD-10-CM | POA: Diagnosis not present

## 2012-02-04 DIAGNOSIS — R7301 Impaired fasting glucose: Secondary | ICD-10-CM | POA: Diagnosis not present

## 2012-02-04 DIAGNOSIS — Z0000 Encounter for general adult medical examination without abnormal findings: Secondary | ICD-10-CM | POA: Diagnosis not present

## 2012-02-04 DIAGNOSIS — E785 Hyperlipidemia, unspecified: Secondary | ICD-10-CM | POA: Diagnosis not present

## 2012-06-18 DIAGNOSIS — M25559 Pain in unspecified hip: Secondary | ICD-10-CM | POA: Diagnosis not present

## 2012-06-18 DIAGNOSIS — I10 Essential (primary) hypertension: Secondary | ICD-10-CM | POA: Diagnosis not present

## 2012-06-18 DIAGNOSIS — N318 Other neuromuscular dysfunction of bladder: Secondary | ICD-10-CM | POA: Diagnosis not present

## 2012-06-18 DIAGNOSIS — R079 Chest pain, unspecified: Secondary | ICD-10-CM | POA: Diagnosis not present

## 2012-07-15 DIAGNOSIS — IMO0001 Reserved for inherently not codable concepts without codable children: Secondary | ICD-10-CM | POA: Diagnosis not present

## 2012-07-15 DIAGNOSIS — R262 Difficulty in walking, not elsewhere classified: Secondary | ICD-10-CM | POA: Diagnosis not present

## 2012-07-15 DIAGNOSIS — R42 Dizziness and giddiness: Secondary | ICD-10-CM | POA: Diagnosis not present

## 2012-07-15 DIAGNOSIS — M6281 Muscle weakness (generalized): Secondary | ICD-10-CM | POA: Diagnosis not present

## 2012-07-17 DIAGNOSIS — M6281 Muscle weakness (generalized): Secondary | ICD-10-CM | POA: Diagnosis not present

## 2012-07-17 DIAGNOSIS — R262 Difficulty in walking, not elsewhere classified: Secondary | ICD-10-CM | POA: Diagnosis not present

## 2012-07-17 DIAGNOSIS — IMO0001 Reserved for inherently not codable concepts without codable children: Secondary | ICD-10-CM | POA: Diagnosis not present

## 2012-07-17 DIAGNOSIS — R42 Dizziness and giddiness: Secondary | ICD-10-CM | POA: Diagnosis not present

## 2012-07-22 DIAGNOSIS — R262 Difficulty in walking, not elsewhere classified: Secondary | ICD-10-CM | POA: Diagnosis not present

## 2012-07-22 DIAGNOSIS — IMO0001 Reserved for inherently not codable concepts without codable children: Secondary | ICD-10-CM | POA: Diagnosis not present

## 2012-07-22 DIAGNOSIS — R42 Dizziness and giddiness: Secondary | ICD-10-CM | POA: Diagnosis not present

## 2012-07-22 DIAGNOSIS — M6281 Muscle weakness (generalized): Secondary | ICD-10-CM | POA: Diagnosis not present

## 2012-07-30 DIAGNOSIS — M6281 Muscle weakness (generalized): Secondary | ICD-10-CM | POA: Diagnosis not present

## 2012-07-30 DIAGNOSIS — R262 Difficulty in walking, not elsewhere classified: Secondary | ICD-10-CM | POA: Diagnosis not present

## 2012-07-30 DIAGNOSIS — R42 Dizziness and giddiness: Secondary | ICD-10-CM | POA: Diagnosis not present

## 2012-07-30 DIAGNOSIS — IMO0001 Reserved for inherently not codable concepts without codable children: Secondary | ICD-10-CM | POA: Diagnosis not present

## 2012-07-31 DIAGNOSIS — R42 Dizziness and giddiness: Secondary | ICD-10-CM | POA: Diagnosis not present

## 2012-07-31 DIAGNOSIS — R262 Difficulty in walking, not elsewhere classified: Secondary | ICD-10-CM | POA: Diagnosis not present

## 2012-07-31 DIAGNOSIS — M6281 Muscle weakness (generalized): Secondary | ICD-10-CM | POA: Diagnosis not present

## 2012-07-31 DIAGNOSIS — IMO0001 Reserved for inherently not codable concepts without codable children: Secondary | ICD-10-CM | POA: Diagnosis not present

## 2012-08-31 DIAGNOSIS — IMO0001 Reserved for inherently not codable concepts without codable children: Secondary | ICD-10-CM | POA: Diagnosis not present

## 2012-08-31 DIAGNOSIS — R262 Difficulty in walking, not elsewhere classified: Secondary | ICD-10-CM | POA: Diagnosis not present

## 2012-08-31 DIAGNOSIS — R42 Dizziness and giddiness: Secondary | ICD-10-CM | POA: Diagnosis not present

## 2012-08-31 DIAGNOSIS — M6281 Muscle weakness (generalized): Secondary | ICD-10-CM | POA: Diagnosis not present

## 2012-09-02 DIAGNOSIS — M899 Disorder of bone, unspecified: Secondary | ICD-10-CM | POA: Diagnosis not present

## 2012-09-12 ENCOUNTER — Encounter: Payer: Self-pay | Admitting: *Deleted

## 2012-09-30 DIAGNOSIS — IMO0001 Reserved for inherently not codable concepts without codable children: Secondary | ICD-10-CM | POA: Diagnosis not present

## 2012-09-30 DIAGNOSIS — R262 Difficulty in walking, not elsewhere classified: Secondary | ICD-10-CM | POA: Diagnosis not present

## 2012-09-30 DIAGNOSIS — M6281 Muscle weakness (generalized): Secondary | ICD-10-CM | POA: Diagnosis not present

## 2012-09-30 DIAGNOSIS — R42 Dizziness and giddiness: Secondary | ICD-10-CM | POA: Diagnosis not present

## 2012-10-01 DIAGNOSIS — R42 Dizziness and giddiness: Secondary | ICD-10-CM | POA: Diagnosis not present

## 2012-10-01 DIAGNOSIS — M6281 Muscle weakness (generalized): Secondary | ICD-10-CM | POA: Diagnosis not present

## 2012-10-01 DIAGNOSIS — R262 Difficulty in walking, not elsewhere classified: Secondary | ICD-10-CM | POA: Diagnosis not present

## 2012-10-01 DIAGNOSIS — IMO0001 Reserved for inherently not codable concepts without codable children: Secondary | ICD-10-CM | POA: Diagnosis not present

## 2012-10-07 DIAGNOSIS — IMO0001 Reserved for inherently not codable concepts without codable children: Secondary | ICD-10-CM | POA: Diagnosis not present

## 2012-10-07 DIAGNOSIS — R42 Dizziness and giddiness: Secondary | ICD-10-CM | POA: Diagnosis not present

## 2012-10-07 DIAGNOSIS — R262 Difficulty in walking, not elsewhere classified: Secondary | ICD-10-CM | POA: Diagnosis not present

## 2012-10-07 DIAGNOSIS — M6281 Muscle weakness (generalized): Secondary | ICD-10-CM | POA: Diagnosis not present

## 2012-10-10 DIAGNOSIS — R42 Dizziness and giddiness: Secondary | ICD-10-CM | POA: Diagnosis not present

## 2012-10-10 DIAGNOSIS — R262 Difficulty in walking, not elsewhere classified: Secondary | ICD-10-CM | POA: Diagnosis not present

## 2012-10-10 DIAGNOSIS — IMO0001 Reserved for inherently not codable concepts without codable children: Secondary | ICD-10-CM | POA: Diagnosis not present

## 2012-10-10 DIAGNOSIS — M6281 Muscle weakness (generalized): Secondary | ICD-10-CM | POA: Diagnosis not present

## 2012-10-13 DIAGNOSIS — M6281 Muscle weakness (generalized): Secondary | ICD-10-CM | POA: Diagnosis not present

## 2012-10-13 DIAGNOSIS — IMO0001 Reserved for inherently not codable concepts without codable children: Secondary | ICD-10-CM | POA: Diagnosis not present

## 2012-10-13 DIAGNOSIS — R262 Difficulty in walking, not elsewhere classified: Secondary | ICD-10-CM | POA: Diagnosis not present

## 2012-10-13 DIAGNOSIS — R42 Dizziness and giddiness: Secondary | ICD-10-CM | POA: Diagnosis not present

## 2012-10-15 DIAGNOSIS — IMO0001 Reserved for inherently not codable concepts without codable children: Secondary | ICD-10-CM | POA: Diagnosis not present

## 2012-10-15 DIAGNOSIS — R262 Difficulty in walking, not elsewhere classified: Secondary | ICD-10-CM | POA: Diagnosis not present

## 2012-10-15 DIAGNOSIS — M6281 Muscle weakness (generalized): Secondary | ICD-10-CM | POA: Diagnosis not present

## 2012-10-15 DIAGNOSIS — R42 Dizziness and giddiness: Secondary | ICD-10-CM | POA: Diagnosis not present

## 2012-10-21 DIAGNOSIS — M6281 Muscle weakness (generalized): Secondary | ICD-10-CM | POA: Diagnosis not present

## 2012-10-21 DIAGNOSIS — R262 Difficulty in walking, not elsewhere classified: Secondary | ICD-10-CM | POA: Diagnosis not present

## 2012-10-21 DIAGNOSIS — R42 Dizziness and giddiness: Secondary | ICD-10-CM | POA: Diagnosis not present

## 2012-10-21 DIAGNOSIS — IMO0001 Reserved for inherently not codable concepts without codable children: Secondary | ICD-10-CM | POA: Diagnosis not present

## 2012-10-23 DIAGNOSIS — R262 Difficulty in walking, not elsewhere classified: Secondary | ICD-10-CM | POA: Diagnosis not present

## 2012-10-23 DIAGNOSIS — M6281 Muscle weakness (generalized): Secondary | ICD-10-CM | POA: Diagnosis not present

## 2012-10-23 DIAGNOSIS — IMO0001 Reserved for inherently not codable concepts without codable children: Secondary | ICD-10-CM | POA: Diagnosis not present

## 2012-10-23 DIAGNOSIS — R42 Dizziness and giddiness: Secondary | ICD-10-CM | POA: Diagnosis not present

## 2013-01-29 DIAGNOSIS — H35379 Puckering of macula, unspecified eye: Secondary | ICD-10-CM | POA: Diagnosis not present

## 2013-02-02 DIAGNOSIS — Z954 Presence of other heart-valve replacement: Secondary | ICD-10-CM | POA: Diagnosis not present

## 2013-02-02 DIAGNOSIS — R011 Cardiac murmur, unspecified: Secondary | ICD-10-CM | POA: Diagnosis not present

## 2013-02-02 DIAGNOSIS — I359 Nonrheumatic aortic valve disorder, unspecified: Secondary | ICD-10-CM | POA: Diagnosis not present

## 2013-02-03 DIAGNOSIS — E039 Hypothyroidism, unspecified: Secondary | ICD-10-CM | POA: Diagnosis not present

## 2013-02-03 DIAGNOSIS — R7301 Impaired fasting glucose: Secondary | ICD-10-CM | POA: Diagnosis not present

## 2013-02-03 DIAGNOSIS — I10 Essential (primary) hypertension: Secondary | ICD-10-CM | POA: Diagnosis not present

## 2013-02-03 DIAGNOSIS — E785 Hyperlipidemia, unspecified: Secondary | ICD-10-CM | POA: Diagnosis not present

## 2013-02-03 DIAGNOSIS — R809 Proteinuria, unspecified: Secondary | ICD-10-CM | POA: Diagnosis not present

## 2013-02-09 DIAGNOSIS — R011 Cardiac murmur, unspecified: Secondary | ICD-10-CM | POA: Diagnosis not present

## 2013-02-23 DIAGNOSIS — Z0000 Encounter for general adult medical examination without abnormal findings: Secondary | ICD-10-CM | POA: Diagnosis not present

## 2013-02-23 DIAGNOSIS — Z1331 Encounter for screening for depression: Secondary | ICD-10-CM | POA: Diagnosis not present

## 2013-02-23 DIAGNOSIS — E785 Hyperlipidemia, unspecified: Secondary | ICD-10-CM | POA: Diagnosis not present

## 2013-02-23 DIAGNOSIS — Z6827 Body mass index (BMI) 27.0-27.9, adult: Secondary | ICD-10-CM | POA: Diagnosis not present

## 2013-02-23 DIAGNOSIS — R7301 Impaired fasting glucose: Secondary | ICD-10-CM | POA: Diagnosis not present

## 2013-02-23 DIAGNOSIS — N318 Other neuromuscular dysfunction of bladder: Secondary | ICD-10-CM | POA: Diagnosis not present

## 2013-02-23 DIAGNOSIS — I10 Essential (primary) hypertension: Secondary | ICD-10-CM | POA: Diagnosis not present

## 2013-02-23 DIAGNOSIS — M899 Disorder of bone, unspecified: Secondary | ICD-10-CM | POA: Diagnosis not present

## 2013-02-23 DIAGNOSIS — Z23 Encounter for immunization: Secondary | ICD-10-CM | POA: Diagnosis not present

## 2013-02-23 DIAGNOSIS — E039 Hypothyroidism, unspecified: Secondary | ICD-10-CM | POA: Diagnosis not present

## 2013-02-24 DIAGNOSIS — Z1212 Encounter for screening for malignant neoplasm of rectum: Secondary | ICD-10-CM | POA: Diagnosis not present

## 2013-07-21 DIAGNOSIS — R011 Cardiac murmur, unspecified: Secondary | ICD-10-CM | POA: Diagnosis not present

## 2013-07-21 DIAGNOSIS — Z954 Presence of other heart-valve replacement: Secondary | ICD-10-CM | POA: Diagnosis not present

## 2013-07-28 DIAGNOSIS — H35379 Puckering of macula, unspecified eye: Secondary | ICD-10-CM | POA: Diagnosis not present

## 2013-08-26 DIAGNOSIS — Z9181 History of falling: Secondary | ICD-10-CM | POA: Diagnosis not present

## 2013-08-26 DIAGNOSIS — M949 Disorder of cartilage, unspecified: Secondary | ICD-10-CM | POA: Diagnosis not present

## 2013-08-26 DIAGNOSIS — I10 Essential (primary) hypertension: Secondary | ICD-10-CM | POA: Diagnosis not present

## 2013-08-26 DIAGNOSIS — R7301 Impaired fasting glucose: Secondary | ICD-10-CM | POA: Diagnosis not present

## 2013-08-26 DIAGNOSIS — N183 Chronic kidney disease, stage 3 unspecified: Secondary | ICD-10-CM | POA: Diagnosis not present

## 2013-08-26 DIAGNOSIS — E785 Hyperlipidemia, unspecified: Secondary | ICD-10-CM | POA: Diagnosis not present

## 2013-08-26 DIAGNOSIS — M899 Disorder of bone, unspecified: Secondary | ICD-10-CM | POA: Diagnosis not present

## 2013-08-26 DIAGNOSIS — E039 Hypothyroidism, unspecified: Secondary | ICD-10-CM | POA: Diagnosis not present

## 2013-08-26 DIAGNOSIS — Z23 Encounter for immunization: Secondary | ICD-10-CM | POA: Diagnosis not present

## 2013-08-31 DIAGNOSIS — H35379 Puckering of macula, unspecified eye: Secondary | ICD-10-CM | POA: Diagnosis not present

## 2014-01-20 DIAGNOSIS — I10 Essential (primary) hypertension: Secondary | ICD-10-CM | POA: Diagnosis not present

## 2014-01-20 DIAGNOSIS — Z23 Encounter for immunization: Secondary | ICD-10-CM | POA: Diagnosis not present

## 2014-01-20 DIAGNOSIS — E785 Hyperlipidemia, unspecified: Secondary | ICD-10-CM | POA: Diagnosis not present

## 2014-01-20 DIAGNOSIS — E079 Disorder of thyroid, unspecified: Secondary | ICD-10-CM | POA: Diagnosis not present

## 2014-01-20 DIAGNOSIS — Z954 Presence of other heart-valve replacement: Secondary | ICD-10-CM | POA: Diagnosis not present

## 2014-01-26 DIAGNOSIS — H53001 Unspecified amblyopia, right eye: Secondary | ICD-10-CM | POA: Diagnosis not present

## 2014-02-16 DIAGNOSIS — L239 Allergic contact dermatitis, unspecified cause: Secondary | ICD-10-CM | POA: Diagnosis not present

## 2014-02-17 DIAGNOSIS — E785 Hyperlipidemia, unspecified: Secondary | ICD-10-CM | POA: Diagnosis not present

## 2014-02-17 DIAGNOSIS — Z6825 Body mass index (BMI) 25.0-25.9, adult: Secondary | ICD-10-CM | POA: Diagnosis not present

## 2014-02-17 DIAGNOSIS — R7301 Impaired fasting glucose: Secondary | ICD-10-CM | POA: Diagnosis not present

## 2014-02-17 DIAGNOSIS — E039 Hypothyroidism, unspecified: Secondary | ICD-10-CM | POA: Diagnosis not present

## 2014-02-17 DIAGNOSIS — L509 Urticaria, unspecified: Secondary | ICD-10-CM | POA: Diagnosis not present

## 2014-02-17 DIAGNOSIS — M859 Disorder of bone density and structure, unspecified: Secondary | ICD-10-CM | POA: Diagnosis not present

## 2014-02-17 DIAGNOSIS — I10 Essential (primary) hypertension: Secondary | ICD-10-CM | POA: Diagnosis not present

## 2014-02-24 DIAGNOSIS — N3281 Overactive bladder: Secondary | ICD-10-CM | POA: Diagnosis not present

## 2014-02-24 DIAGNOSIS — I6529 Occlusion and stenosis of unspecified carotid artery: Secondary | ICD-10-CM | POA: Diagnosis not present

## 2014-02-24 DIAGNOSIS — M8580 Other specified disorders of bone density and structure, unspecified site: Secondary | ICD-10-CM | POA: Diagnosis not present

## 2014-02-24 DIAGNOSIS — R7301 Impaired fasting glucose: Secondary | ICD-10-CM | POA: Diagnosis not present

## 2014-02-24 DIAGNOSIS — L509 Urticaria, unspecified: Secondary | ICD-10-CM | POA: Diagnosis not present

## 2014-02-24 DIAGNOSIS — Z1389 Encounter for screening for other disorder: Secondary | ICD-10-CM | POA: Diagnosis not present

## 2014-02-24 DIAGNOSIS — E785 Hyperlipidemia, unspecified: Secondary | ICD-10-CM | POA: Diagnosis not present

## 2014-02-24 DIAGNOSIS — Z0000 Encounter for general adult medical examination without abnormal findings: Secondary | ICD-10-CM | POA: Diagnosis not present

## 2014-02-24 DIAGNOSIS — E039 Hypothyroidism, unspecified: Secondary | ICD-10-CM | POA: Diagnosis not present

## 2014-02-24 DIAGNOSIS — I10 Essential (primary) hypertension: Secondary | ICD-10-CM | POA: Diagnosis not present

## 2014-03-01 DIAGNOSIS — H3531 Nonexudative age-related macular degeneration: Secondary | ICD-10-CM | POA: Diagnosis not present

## 2014-03-08 ENCOUNTER — Emergency Department: Payer: Self-pay | Admitting: Emergency Medicine

## 2014-03-08 DIAGNOSIS — S0093XA Contusion of unspecified part of head, initial encounter: Secondary | ICD-10-CM | POA: Diagnosis not present

## 2014-03-08 DIAGNOSIS — S199XXA Unspecified injury of neck, initial encounter: Secondary | ICD-10-CM | POA: Diagnosis not present

## 2014-03-08 DIAGNOSIS — S0003XA Contusion of scalp, initial encounter: Secondary | ICD-10-CM | POA: Diagnosis not present

## 2014-03-08 DIAGNOSIS — W19XXXA Unspecified fall, initial encounter: Secondary | ICD-10-CM | POA: Diagnosis not present

## 2014-03-08 DIAGNOSIS — S50312A Abrasion of left elbow, initial encounter: Secondary | ICD-10-CM | POA: Diagnosis not present

## 2014-03-08 DIAGNOSIS — S0083XA Contusion of other part of head, initial encounter: Secondary | ICD-10-CM | POA: Diagnosis not present

## 2014-10-08 ENCOUNTER — Encounter: Payer: Self-pay | Admitting: Internal Medicine

## 2015-03-22 ENCOUNTER — Emergency Department
Admission: EM | Admit: 2015-03-22 | Discharge: 2015-03-22 | Disposition: A | Discharge status: Home / Self Care | Payer: PPO | Attending: Emergency Medicine | Admitting: Emergency Medicine

## 2015-03-22 ENCOUNTER — Emergency Department: Payer: PPO

## 2015-03-22 DIAGNOSIS — M25552 Pain in left hip: Secondary | ICD-10-CM

## 2015-03-22 DIAGNOSIS — N3090 Cystitis, unspecified without hematuria: Secondary | ICD-10-CM | POA: Insufficient documentation

## 2015-03-22 DIAGNOSIS — Z23 Encounter for immunization: Secondary | ICD-10-CM | POA: Diagnosis not present

## 2015-03-22 DIAGNOSIS — G8929 Other chronic pain: Secondary | ICD-10-CM | POA: Insufficient documentation

## 2015-03-22 DIAGNOSIS — S0181XA Laceration without foreign body of other part of head, initial encounter: Secondary | ICD-10-CM | POA: Diagnosis not present

## 2015-03-22 DIAGNOSIS — Z79899 Other long term (current) drug therapy: Secondary | ICD-10-CM | POA: Insufficient documentation

## 2015-03-22 DIAGNOSIS — S79912A Unspecified injury of left hip, initial encounter: Secondary | ICD-10-CM | POA: Diagnosis not present

## 2015-03-22 DIAGNOSIS — S79911A Unspecified injury of right hip, initial encounter: Secondary | ICD-10-CM | POA: Diagnosis not present

## 2015-03-22 DIAGNOSIS — Z7982 Long term (current) use of aspirin: Secondary | ICD-10-CM | POA: Diagnosis not present

## 2015-03-22 DIAGNOSIS — Z87891 Personal history of nicotine dependence: Secondary | ICD-10-CM | POA: Insufficient documentation

## 2015-03-22 DIAGNOSIS — W01198A Fall on same level from slipping, tripping and stumbling with subsequent striking against other object, initial encounter: Secondary | ICD-10-CM | POA: Insufficient documentation

## 2015-03-22 DIAGNOSIS — Y92009 Unspecified place in unspecified non-institutional (private) residence as the place of occurrence of the external cause: Secondary | ICD-10-CM | POA: Insufficient documentation

## 2015-03-22 DIAGNOSIS — S0990XA Unspecified injury of head, initial encounter: Secondary | ICD-10-CM | POA: Diagnosis present

## 2015-03-22 DIAGNOSIS — Y9389 Activity, other specified: Secondary | ICD-10-CM | POA: Insufficient documentation

## 2015-03-22 DIAGNOSIS — I10 Essential (primary) hypertension: Secondary | ICD-10-CM | POA: Diagnosis not present

## 2015-03-22 DIAGNOSIS — Y998 Other external cause status: Secondary | ICD-10-CM | POA: Insufficient documentation

## 2015-03-22 LAB — URINALYSIS COMPLETE WITH MICROSCOPIC (ARMC ONLY)
Bilirubin Urine: NEGATIVE
Glucose, UA: NEGATIVE mg/dL
HGB URINE DIPSTICK: NEGATIVE
Ketones, ur: NEGATIVE mg/dL
Nitrite: NEGATIVE
PROTEIN: NEGATIVE mg/dL
SPECIFIC GRAVITY, URINE: 1.012 (ref 1.005–1.030)
pH: 5 (ref 5.0–8.0)

## 2015-03-22 MED ORDER — CEPHALEXIN 500 MG PO CAPS: 500.0000 mg | ORAL_CAPSULE | Freq: Three times a day (TID) | ORAL | Status: DC

## 2015-03-22 MED ORDER — TETANUS-DIPHTHERIA TOXOIDS TD 5-2 LFU IM INJ
0.5000 mL | INJECTION | Freq: Once | INTRAMUSCULAR | Status: DC
  Filled 2015-03-22: qty 0.5

## 2015-03-22 MED ORDER — TETANUS-DIPHTH-ACELL PERTUSSIS 5-2.5-18.5 LF-MCG/0.5 IM SUSP
0.5000 mL | Freq: Once | INTRAMUSCULAR | Status: AC
  Administered 2015-03-22: 0.5 mL via INTRAMUSCULAR
  Filled 2015-03-22: qty 0.5

## 2015-03-22 NOTE — ED Notes (Signed)
Called pharmacy for TDap

## 2015-03-22 NOTE — ED Notes (Signed)
Patient transported to CT 

## 2015-03-22 NOTE — Discharge Instructions (Signed)
Follow-up with primary care in 1 week for a recheck of your urine test to ensure that your symptoms have resolved.  Facial Laceration  A facial laceration is a cut on the face. These injuries can be painful and cause bleeding. Lacerations usually heal quickly, but they need special care to reduce scarring. DIAGNOSIS  Your health care provider will take a medical history, ask for details about how the injury occurred, and examine the wound to determine how deep the cut is. TREATMENT  Some facial lacerations may not require closure. Others may not be able to be closed because of an increased risk of infection. The risk of infection and the chance for successful closure will depend on various factors, including the amount of time since the injury occurred. The wound may be cleaned to help prevent infection. If closure is appropriate, pain medicines may be given if needed. Your health care provider will use stitches (sutures), wound glue (adhesive), or skin adhesive strips to repair the laceration. These tools bring the skin edges together to allow for faster healing and a better cosmetic outcome. If needed, you may also be given a tetanus shot. HOME CARE INSTRUCTIONS  Only take over-the-counter or prescription medicines as directed by your health care provider.  Follow your health care provider's instructions for wound care. These instructions will vary depending on the technique used for closing the wound. For Sutures:  Keep the wound clean and dry.   If you were given a bandage (dressing), you should change it at least once a day. Also change the dressing if it becomes wet or dirty, or as directed by your health care provider.   Wash the wound with soap and water 2 times a day. Rinse the wound off with water to remove all soap. Pat the wound dry with a clean towel.   After cleaning, apply a thin layer of the antibiotic ointment recommended by your health care provider. This will help prevent  infection and keep the dressing from sticking.   You may shower as usual after the first 24 hours. Do not soak the wound in water until the sutures are removed.   Get your sutures removed as directed by your health care provider. With facial lacerations, sutures should usually be taken out after 4-5 days to avoid stitch marks.   Wait a few days after your sutures are removed before applying any makeup. For Skin Adhesive Strips:  Keep the wound clean and dry.   Do not get the skin adhesive strips wet. You may bathe carefully, using caution to keep the wound dry.   If the wound gets wet, pat it dry with a clean towel.   Skin adhesive strips will fall off on their own. You may trim the strips as the wound heals. Do not remove skin adhesive strips that are still stuck to the wound. They will fall off in time.  For Wound Adhesive:  You may briefly wet your wound in the shower or bath. Do not soak or scrub the wound. Do not swim. Avoid periods of heavy sweating until the skin adhesive has fallen off on its own. After showering or bathing, gently pat the wound dry with a clean towel.   Do not apply liquid medicine, cream medicine, ointment medicine, or makeup to your wound while the skin adhesive is in place. This may loosen the film before your wound is healed.   If a dressing is placed over the wound, be careful not to apply tape  directly over the skin adhesive. This may cause the adhesive to be pulled off before the wound is healed.   Avoid prolonged exposure to sunlight or tanning lamps while the skin adhesive is in place.  The skin adhesive will usually remain in place for 5-10 days, then naturally fall off the skin. Do not pick at the adhesive film.  After Healing: Once the wound has healed, cover the wound with sunscreen during the day for 1 full year. This can help minimize scarring. Exposure to ultraviolet light in the first year will darken the scar. It can take 1-2 years  for the scar to lose its redness and to heal completely.  SEEK MEDICAL CARE IF:  You have a fever. SEEK IMMEDIATE MEDICAL CARE IF:  You have redness, pain, or swelling around the wound.   You see ayellowish-white fluid (pus) coming from the wound.    This information is not intended to replace advice given to you by your health care provider. Make sure you discuss any questions you have with your health care provider.   Document Released: 04/26/2004 Document Revised: 04/09/2014 Document Reviewed: 10/30/2012 Elsevier Interactive Patient Education 2016 [place], Adult You have a head injury. Headaches and throwing up (vomiting) are common after a head injury. It should be easy to wake up from sleeping. Sometimes you must stay in the hospital. Most problems happen within the first 24 hours. Side effects may occur up to 7-10 days after the injury.  WHAT ARE THE TYPES OF HEAD INJURIES? Head injuries can be as minor as a bump. Some head injuries can be more severe. More severe head injuries include:  A jarring injury to the brain (concussion).  A bruise of the brain (contusion). This mean there is bleeding in the brain that can cause swelling.  A cracked skull (skull fracture).  Bleeding in the brain that collects, clots, and forms a bump (hematoma). WHEN SHOULD I GET HELP RIGHT AWAY?   You are confused or sleepy.  You cannot be woken up.  You feel sick to your stomach (nauseous) or keep throwing up (vomiting).  Your dizziness or unsteadiness is getting worse.  You have very bad, lasting headaches that are not helped by medicine. Take medicines only as told by your doctor.  You cannot use your arms or legs like normal.  You cannot walk.  You notice changes in the black spots in the center of the colored part of your eye (pupil).  You have clear or bloody fluid coming from your nose or ears.  You have trouble seeing. During the next 24 hours after the  injury, you must stay with someone who can watch you. This person should get help right away (call 911 in the [place] if you start to shake and are not able to control it (have seizures), you pass out, or you are unable to wake up. HOW CAN I PREVENT A HEAD INJURY IN THE FUTURE?  Wear seat belts.  Wear a helmet while bike riding and playing sports like football.  Stay away from dangerous activities around the house. WHEN CAN I RETURN TO NORMAL ACTIVITIES AND ATHLETICS? See your doctor before doing these activities. You should not do normal activities or play contact sports until 1 week after the following symptoms have stopped:  Headache that does not go away.  Dizziness.  Poor attention.  Confusion.  Memory problems.  Sickness to your stomach or throwing up.  Tiredness.  Fussiness.  Bothered by  bright lights or loud noises.  Anxiousness or depression.  Restless sleep. MAKE SURE YOU:   Understand these instructions.  Will watch your condition.  Will get help right away if you are not doing well or get worse.   This information is not intended to replace advice given to you by your health care provider. Make sure you discuss any questions you have with your health care provider.   Document Released: 03/01/2008 Document Revised: 04/09/2014 Document Reviewed: 11/24/2012 Elsevier Interactive Patient Education 2016 [place], [place], or Adhesive Wound Closure Health care providers use stitches (sutures), staples, and certain glue (skin adhesives) to hold skin together while it heals (wound closure). You may need this treatment after you have surgery or if you cut your skin accidentally. These methods help your skin to heal more quickly and make it less likely that you will have a scar. A wound may take several months to heal completely. The type of wound you have determines when your wound gets closed. In most cases, the wound is closed as soon as possible  (primary skin closure). Sometimes, closure is delayed so the wound can be cleaned and allowed to heal naturally. This reduces the chance of infection. Delayed closure may be needed if your wound:  Is caused by a bite.  Happened more than 6 hours ago.  Involves loss of skin or the tissues under the skin.  Has dirt or debris in it that cannot be removed.  Is infected. WHAT ARE THE DIFFERENT KINDS OF WOUND CLOSURES? There are many options for wound closure. The one that your health care provider uses depends on how deep and how large your wound is. Adhesive Glue To use this type of glue to close a wound, your health care provider holds the edges of the wound together and paints the glue on the surface of your skin. You may need more than one layer of glue. Then the wound may be covered with a light bandage (dressing). This type of skin closure may be used for small wounds that are not deep (superficial). Using glue for wound closure is less painful than other methods. It does not require a medicine that numbs the area (local anesthetic). This method also leaves nothing to be removed. Adhesive glue is often used for children and on facial wounds. Adhesive glue cannot be used for wounds that are deep, uneven, or bleeding. It is not used inside of a wound.  Adhesive Strips These strips are made of sticky (adhesive), porous paper. They are applied across your skin edges like a regular adhesive bandage. You leave them on until they fall off. Adhesive strips may be used to close very superficial wounds. They may also be used along with sutures to improve the closure of your skin edges.  Sutures Sutures are the oldest method of wound closure. Sutures can be made from natural substances, such as silk, or from synthetic materials, such as nylon and steel. They can be made from a material that your body can break down as your wound heals (absorbable), or they can be made from a material that needs to be  removed from your skin (nonabsorbable). They come in many different strengths and sizes. Your health care provider attaches the sutures to a steel needle on one end. Sutures can be passed through your skin, or through the tissues beneath your skin. Then they are tied and cut. Your skin edges may be closed in one continuous stitch or in separate stitches.  Sutures are strong and can be used for all kinds of wounds. Absorbable sutures may be used to close tissues under the skin. The disadvantage of sutures is that they may cause skin reactions that lead to infection. Nonabsorbable sutures need to be removed. Staples When surgical staples are used to close a wound, the edges of your skin on both sides of the wound are brought close together. A staple is placed across the wound, and an instrument secures the edges together. Staples are often used to close surgical cuts (incisions). Staples are faster to use than sutures, and they cause less skin reaction. Staples need to be removed using a tool that bends the staples away from your skin. HOW DO I CARE FOR MY WOUND CLOSURE?  Take medicines only as directed by your health care provider.  If you were prescribed an antibiotic medicine for your wound, finish it all even if you start to feel better.  Use ointments or creams only as directed by your health care provider.  Wash your hands with soap and water before and after touching your wound.  Do not soak your wound in water. Do not take baths, swim, or use a hot tub until your health care provider approves.  Ask your health care provider when you can start showering. Cover your wound if directed by your health care provider.  Do not take out your own sutures or staples.  Do not pick at your wound. Picking can cause an infection.  Keep all follow-up visits as directed by your health care provider. This is important. HOW LONG WILL I HAVE MY WOUND CLOSURE?  Leave adhesive glue on your skin until the  glue peels away.  Leave adhesive strips on your skin until the strips fall off.  Absorbable sutures will dissolve within several days.  Nonabsorbable sutures and staples must be removed. The location of the wound will determine how long they stay in. This can range from several days to a couple of weeks. WHEN SHOULD I SEEK HELP FOR MY WOUND CLOSURE? Contact your health care provider if:  You have a fever.  You have chills.  You have drainage, redness, swelling, or pain at your wound.  There is a bad smell coming from your wound.  The skin edges of your wound start to separate after your sutures have been removed.  Your wound becomes thick, raised, and darker in color after your sutures come out (scarring).   This information is not intended to replace advice given to you by your health care provider. Make sure you discuss any questions you have with your health care provider.   Document Released: 12/12/2000 Document Revised: 04/09/2014 Document Reviewed: 08/26/2013 Elsevier Interactive Patient Education 2016 [employer]  Urinary Tract Infection Urinary tract infections (UTIs) can develop anywhere along your urinary tract. Your urinary tract is your body's drainage system for removing wastes and extra water. Your urinary tract includes two kidneys, two ureters, a bladder, and a urethra. Your kidneys are a pair of bean-shaped organs. Each kidney is about the size of your fist. They are located below your ribs, one on each side of your spine. CAUSES Infections are caused by microbes, which are microscopic organisms, including fungi, viruses, and bacteria. These organisms are so small that they can only be seen through a microscope. Bacteria are the microbes that most commonly cause UTIs. SYMPTOMS  Symptoms of UTIs may vary by age and gender of the patient and by the location of the infection. Symptoms  in young women typically include a frequent and intense urge to urinate and a  painful, burning feeling in the bladder or urethra during urination. Older women and men are more likely to be tired, shaky, and weak and have muscle aches and abdominal pain. A fever may mean the infection is in your kidneys. Other symptoms of a kidney infection include pain in your back or sides below the ribs, nausea, and vomiting. DIAGNOSIS To diagnose a UTI, your caregiver will ask you about your symptoms. Your caregiver will also ask you to provide a urine sample. The urine sample will be tested for bacteria and white blood cells. White blood cells are made by your body to help fight infection. TREATMENT  Typically, UTIs can be treated with medication. Because most UTIs are caused by a bacterial infection, they usually can be treated with the use of antibiotics. The choice of antibiotic and length of treatment depend on your symptoms and the type of bacteria causing your infection. HOME CARE INSTRUCTIONS  If you were prescribed antibiotics, take them exactly as your caregiver instructs you. Finish the medication even if you feel better after you have only taken some of the medication.  Drink enough water and fluids to keep your urine clear or pale yellow.  Avoid caffeine, tea, and carbonated beverages. They tend to irritate your bladder.  Empty your bladder often. Avoid holding urine for long periods of time.  Empty your bladder before and after sexual intercourse.  After a bowel movement, women should cleanse from front to back. Use each tissue only once. SEEK MEDICAL CARE IF:   You have back pain.  You develop a fever.  Your symptoms do not begin to resolve within 3 days. SEEK IMMEDIATE MEDICAL CARE IF:   You have severe back pain or lower abdominal pain.  You develop chills.  You have nausea or vomiting.  You have continued burning or discomfort with urination. MAKE SURE YOU:   Understand these instructions.  Will watch your condition.  Will get help right away if you  are not doing well or get worse.   This information is not intended to replace advice given to you by your health care provider. Make sure you discuss any questions you have with your health care provider.   Document Released: 12/27/2004 Document Revised: 12/08/2014 Document Reviewed: 04/27/2011 Elsevier Interactive Patient Education [employer].

## 2015-03-22 NOTE — ED Notes (Signed)
Pt waiting for medication.  Pharmacy to send up.

## 2015-03-22 NOTE — ED Notes (Signed)
Langley Gauss, RN called pharmacy for patient medication again, pharmacy informed us they do not have the ordered medication.  MD to discontinue and place new order.

## 2015-03-22 NOTE — ED Provider Notes (Signed)
[facility] Emergency Department Provider Note  ____________________________________________  Time seen: [id number] on arrival by EMS  I have reviewed the triage vital signs and the nursing notes.   HISTORY  Chief Complaint Fall    HPI [name] is a 81 y.o. female who had a mechanical trip and fall at home. She was using her walker and when she crossed the threshold in a doorway, the walker got stuck causing her to fall forward somewhat over the walker and off to the side. She hit her head against the ground but denies loss of consciousness vision change numbness tingling weakness or neck pain. She denies any other complaints except for feeling woozy and fatigue for the past week. She does report that sometimes she has some urinary frequency or dysuria. No chest pain shortness of breath fever or vomiting or diarrhea. No abdominal pain or back pain.     Past Medical History  Diagnosis Date  . Severe aortic stenosis   . HTN (hypertension)   . Dyslipidemia   . Hypothyroidism   . Arthritis      Patient Active Problem List   Diagnosis Date Noted  . Severe aortic stenosis   . HTN (hypertension)   . Dyslipidemia   . Hypothyroidism   . CONSTIPATION 06/20/2009     Past Surgical History  Procedure Laterality Date  . Aortic valve replacement  1999  . Total abdominal hysterectomy      Uterine cancer  . Cyst excision  20 years ago    both breast had cyst removed     Current Outpatient Rx  Name  Route  Sig  Dispense  Refill  . aspirin 81 MG tablet   Oral   Take 81 mg by mouth daily.           . calcium-vitamin D (OSCAL) 250-125 MG-UNIT per tablet   Oral   Take 1 tablet by mouth daily.   30 tablet      . ipratropium (ATROVENT) 0.03 % nasal spray   Each Nare   Place 2 sprays into both nostrils 2 (two) times daily.      11   . levothyroxine (SYNTHROID, LEVOTHROID) 100 MCG tablet   Oral   Take 100 mcg by mouth daily before breakfast.          . metoprolol (LOPRESSOR) 50 MG tablet   Oral   Take 50 mg by mouth 2 (two) times daily.          . Multiple Vitamin (MULTIVITAMIN) tablet   Oral   Take 1 tablet by mouth daily.           . Multiple Vitamins-Minerals (ICAPS PO)   Oral   Take by mouth daily.           . rosuvastatin (CRESTOR) 20 MG tablet   Oral   Take 20 mg by mouth daily.         . cephALEXin (KEFLEX) 500 MG capsule   Oral   Take 1 capsule (500 mg total) by mouth 3 (three) times daily.   21 capsule   0      Allergies Review of patient's allergies indicates no known allergies.   Family History  Problem Relation Age of Onset  . Heart disease Father   . Heart disease Mother     Social History Social History  Substance Use Topics  . Smoking status: Former Smoker    Types: Cigarettes    Quit date: 06/20/1995  .  Smokeless tobacco: None  . Alcohol Use: 4.2 oz/week    7 Shots of liquor per week    Review of Systems  Constitutional:   No fever or chills. No weight changes Eyes:   No blurry vision or double vision.  ENT:   No sore throat. Cardiovascular:   No chest pain. Respiratory:   No dyspnea or cough. Gastrointestinal:   Negative for abdominal pain, vomiting and diarrhea.  No BRBPR or melena. Genitourinary:   Negative for dysuria, urinary retention, bloody urine, or difficulty urinating. Musculoskeletal:   Negative for back pain. Chronic bilateral hip pain, left greater than right  Skin:   Negative for rash. Neurological:   Negative for headaches, focal weakness or numbness. Psychiatric:  No anxiety or depression.   Endocrine:  No hot/cold intolerance, changes in energy, or sleep difficulty.  10-point ROS otherwise negative.  ____________________________________________   PHYSICAL EXAM:  VITAL SIGNS: ED Triage Vitals  Enc Vitals Group     BP 03/22/15 1217 169/61 mmHg     Pulse Rate 03/22/15 1217 88     Resp 03/22/15 1217 16     Temp 03/22/15 1217 98 F (36.7 C)      Temp Source 03/22/15 1217 Oral     SpO2 03/22/15 1217 99 %     Weight 03/22/15 1217 136 lb (61.689 kg)     Height 03/22/15 1217 5' (1.524 m)     Head Cir --      Peak Flow --      Pain Score --      Pain Loc --      Pain Edu? --      Excl. in [place]? --     Vital signs reviewed, nursing assessments reviewed.   Constitutional:   Alert and oriented. Well appearing and in no distress. Eyes:   No scleral icterus. No conjunctival pallor. PERRL. EOMI ENT   Head:   Normocephalic with 1.5 cm linear superficial laceration in the middle of the forehead. Hemostatic.   Nose:   No congestion/rhinnorhea. No septal hematoma   Mouth/Throat:   MMM, no pharyngeal erythema. No peritonsillar mass. No uvula shift. No intraoral injuries   Neck:   No stridor. No SubQ emphysema. No meningismus. Hematological/Lymphatic/Immunilogical:   No cervical lymphadenopathy. Cardiovascular:   RRR. Normal and symmetric distal pulses are present in all extremities. Loud systolic murmur Respiratory:   Normal respiratory effort without tachypnea nor retractions. Breath sounds are clear and equal bilaterally. No wheezes/rales/rhonchi. Gastrointestinal:   Soft with slight suprapubic tenderness. No distention. There is no CVA tenderness.  No rebound, rigidity, or guarding. Genitourinary:   deferred Musculoskeletal:   normal range of motion in all extremities. No joint effusions.  Tenderness in bilateral hips at the proximal femur, right greater than left. No abnormal shortening or rotation of the lower extremities.  No edema. Neurologic:   Normal speech and language.  CN 2-10 normal. Motor grossly intact. No gross focal neurologic deficits are appreciated.  Skin:    Skin is warm, dry and intact. No rash noted.  No petechiae, purpura, or bullae. Psychiatric:   Mood and affect are normal. Speech and behavior are normal. Patient exhibits appropriate insight and  judgment.  ____________________________________________    LABS (pertinent positives/negatives) (all labs ordered are listed, but only abnormal results are displayed) Labs Reviewed  URINALYSIS COMPLETEWITH MICROSCOPIC ([facility] ONLY) - Abnormal; Notable for the following:    Color, Urine YELLOW (*)    APPearance CLEAR (*)    Leukocytes,  UA TRACE (*)    Bacteria, UA RARE (*)    Squamous Epithelial / LPF 0-5 (*)    All other components within normal limits  URINE CULTURE   ____________________________________________   EKG    ____________________________________________    RADIOLOGY  CT head unremarkable X-ray pelvis and bilateral hips unremarkable ____________________________________________   PROCEDURES  LACERATION REPAIR Performed by: Joni Fears, Lauraine Crespo Authorized by: Carrie Mew Consent: Verbal consent obtained. Risks and benefits: risks, benefits and alternatives were discussed Consent given by: patient Patient identity confirmed: provided demographic data Prepped and Draped in normal sterile fashion Wound explored  Laceration Location: Midline forehead  Laceration Length: 2cm  No Foreign Bodies seen or palpated  Anesthesia: none Irrigation method: syringe Amount of cleaning: standard  Skin closure: cyanoacrylate skin adhesive Technique: topical application, 4 passes  Patient tolerance: Patient tolerated the procedure well with no immediate complications.  ____________________________________________   INITIAL IMPRESSION / ASSESSMENT AND PLAN / ED COURSE  Pertinent labs & imaging results that were available during my care of the patient were reviewed by me and considered in my medical decision making (see chart for details).  Patient presents after mechanical fall. Low suspicion for cardiopulmonary or vascular event, no evidence of stroke or intracranial hemorrhage, but will get a CT of her head for further screening for intracranial injury.  We will plan to repair the wound with skin adhesive. We'll get x-rays of the pelvis and hips and check the urine as well. The patient believes that she had routine immunizations at her recent primary care visit in April, but I'm unable to confirm that she had a tetanus shot at that time. She does not know specifically what she had only that she had whenever she was due for. We'll give her a tetanus booster today to be safe. Absent any serious injury, the patient is medically stable and suitable for discharge home and outpatient follow-up.  ----------------------------------------- 2:56 PM on 03/22/2015 -----------------------------------------  Workup negative. UA equivocal for UTI. The symptoms, we'll treat with Keflex and have her follow up with primary care in 1 week for test of cure. Overall well-appearing, medically stable. Low suspicion for pyelonephritis or sepsis   ____________________________________________   FINAL CLINICAL IMPRESSION(S) / ED DIAGNOSES  Final diagnoses:  Forehead laceration, initial encounter  Chronic hip pain, left  Cystitis      Carrie Mew, MD 03/22/15 1456

## 2015-03-22 NOTE — ED Notes (Signed)
Pt arrived via EMS c/o fall and hitting head abstaining a right forehead injury. Per EMS Pt denies neck and back pain, but has been complaining of feeling "woozy" for a week. EMS VS: BP 173/68, R 92, and O2 98%.

## 2015-03-22 NOTE — ED Notes (Signed)
Pt head cleaned of blood with bath clothes.

## 2015-03-24 LAB — URINE CULTURE

## 2015-04-05 DIAGNOSIS — E784 Other hyperlipidemia: Secondary | ICD-10-CM | POA: Diagnosis not present

## 2015-04-05 DIAGNOSIS — E039 Hypothyroidism, unspecified: Secondary | ICD-10-CM | POA: Diagnosis not present

## 2015-04-05 DIAGNOSIS — M25552 Pain in left hip: Secondary | ICD-10-CM | POA: Diagnosis not present

## 2015-04-05 DIAGNOSIS — I10 Essential (primary) hypertension: Secondary | ICD-10-CM | POA: Diagnosis not present

## 2015-04-21 DIAGNOSIS — M5136 Other intervertebral disc degeneration, lumbar region: Secondary | ICD-10-CM | POA: Diagnosis not present

## 2015-04-21 DIAGNOSIS — M1612 Unilateral primary osteoarthritis, left hip: Secondary | ICD-10-CM | POA: Diagnosis not present

## 2015-04-21 DIAGNOSIS — M25552 Pain in left hip: Secondary | ICD-10-CM | POA: Diagnosis not present

## 2015-05-24 ENCOUNTER — Emergency Department
Admission: EM | Admit: 2015-05-24 | Discharge: 2015-05-24 | Disposition: A | Discharge status: Other Acute Inpatient Hospital | Payer: PPO | Attending: Emergency Medicine | Admitting: Emergency Medicine

## 2015-05-24 ENCOUNTER — Emergency Department: Payer: PPO

## 2015-05-24 ENCOUNTER — Encounter: Payer: Self-pay | Admitting: Emergency Medicine

## 2015-05-24 DIAGNOSIS — S12121A Other nondisplaced dens fracture, initial encounter for closed fracture: Secondary | ICD-10-CM | POA: Diagnosis not present

## 2015-05-24 DIAGNOSIS — S12100A Unspecified displaced fracture of second cervical vertebra, initial encounter for closed fracture: Secondary | ICD-10-CM | POA: Diagnosis not present

## 2015-05-24 DIAGNOSIS — S79912A Unspecified injury of left hip, initial encounter: Secondary | ICD-10-CM | POA: Diagnosis not present

## 2015-05-24 DIAGNOSIS — Z792 Long term (current) use of antibiotics: Secondary | ICD-10-CM | POA: Diagnosis not present

## 2015-05-24 DIAGNOSIS — Y998 Other external cause status: Secondary | ICD-10-CM | POA: Insufficient documentation

## 2015-05-24 DIAGNOSIS — W01198A Fall on same level from slipping, tripping and stumbling with subsequent striking against other object, initial encounter: Secondary | ICD-10-CM | POA: Insufficient documentation

## 2015-05-24 DIAGNOSIS — M8580 Other specified disorders of bone density and structure, unspecified site: Secondary | ICD-10-CM | POA: Diagnosis not present

## 2015-05-24 DIAGNOSIS — M25552 Pain in left hip: Secondary | ICD-10-CM | POA: Diagnosis not present

## 2015-05-24 DIAGNOSIS — M5030 Other cervical disc degeneration, unspecified cervical region: Secondary | ICD-10-CM | POA: Diagnosis not present

## 2015-05-24 DIAGNOSIS — S12000A Unspecified displaced fracture of first cervical vertebra, initial encounter for closed fracture: Secondary | ICD-10-CM | POA: Diagnosis not present

## 2015-05-24 DIAGNOSIS — Z043 Encounter for examination and observation following other accident: Secondary | ICD-10-CM | POA: Diagnosis not present

## 2015-05-24 DIAGNOSIS — M47819 Spondylosis without myelopathy or radiculopathy, site unspecified: Secondary | ICD-10-CM | POA: Diagnosis not present

## 2015-05-24 DIAGNOSIS — Z0389 Encounter for observation for other suspected diseases and conditions ruled out: Secondary | ICD-10-CM | POA: Diagnosis not present

## 2015-05-24 DIAGNOSIS — M2578 Osteophyte, vertebrae: Secondary | ICD-10-CM | POA: Diagnosis not present

## 2015-05-24 DIAGNOSIS — T148 Other injury of unspecified body region: Secondary | ICD-10-CM | POA: Diagnosis not present

## 2015-05-24 DIAGNOSIS — Z87891 Personal history of nicotine dependence: Secondary | ICD-10-CM | POA: Diagnosis not present

## 2015-05-24 DIAGNOSIS — S20412A Abrasion of left back wall of thorax, initial encounter: Secondary | ICD-10-CM | POA: Diagnosis not present

## 2015-05-24 DIAGNOSIS — M542 Cervicalgia: Secondary | ICD-10-CM | POA: Diagnosis not present

## 2015-05-24 DIAGNOSIS — S12112A Nondisplaced Type II dens fracture, initial encounter for closed fracture: Secondary | ICD-10-CM | POA: Diagnosis not present

## 2015-05-24 DIAGNOSIS — Z79899 Other long term (current) drug therapy: Secondary | ICD-10-CM | POA: Diagnosis not present

## 2015-05-24 DIAGNOSIS — S12120A Other displaced dens fracture, initial encounter for closed fracture: Secondary | ICD-10-CM | POA: Diagnosis not present

## 2015-05-24 DIAGNOSIS — R51 Headache: Secondary | ICD-10-CM | POA: Diagnosis not present

## 2015-05-24 DIAGNOSIS — I10 Essential (primary) hypertension: Secondary | ICD-10-CM | POA: Diagnosis not present

## 2015-05-24 DIAGNOSIS — M436 Torticollis: Secondary | ICD-10-CM | POA: Diagnosis not present

## 2015-05-24 DIAGNOSIS — S3993XA Unspecified injury of pelvis, initial encounter: Secondary | ICD-10-CM | POA: Diagnosis not present

## 2015-05-24 DIAGNOSIS — M546 Pain in thoracic spine: Secondary | ICD-10-CM | POA: Diagnosis not present

## 2015-05-24 DIAGNOSIS — Y9389 Activity, other specified: Secondary | ICD-10-CM | POA: Insufficient documentation

## 2015-05-24 DIAGNOSIS — Z7982 Long term (current) use of aspirin: Secondary | ICD-10-CM | POA: Insufficient documentation

## 2015-05-24 DIAGNOSIS — I491 Atrial premature depolarization: Secondary | ICD-10-CM | POA: Diagnosis not present

## 2015-05-24 DIAGNOSIS — Y9289 Other specified places as the place of occurrence of the external cause: Secondary | ICD-10-CM | POA: Diagnosis not present

## 2015-05-24 DIAGNOSIS — S0990XA Unspecified injury of head, initial encounter: Secondary | ICD-10-CM | POA: Insufficient documentation

## 2015-05-24 DIAGNOSIS — W19XXXA Unspecified fall, initial encounter: Secondary | ICD-10-CM | POA: Diagnosis not present

## 2015-05-24 DIAGNOSIS — S129XXA Fracture of neck, unspecified, initial encounter: Secondary | ICD-10-CM | POA: Diagnosis not present

## 2015-05-24 DIAGNOSIS — S199XXA Unspecified injury of neck, initial encounter: Secondary | ICD-10-CM | POA: Diagnosis not present

## 2015-05-24 DIAGNOSIS — M40204 Unspecified kyphosis, thoracic region: Secondary | ICD-10-CM | POA: Diagnosis not present

## 2015-05-24 DIAGNOSIS — W010XXS Fall on same level from slipping, tripping and stumbling without subsequent striking against object, sequela: Secondary | ICD-10-CM | POA: Diagnosis not present

## 2015-05-24 LAB — CBC
HCT: 36.1 % (ref 35.0–47.0)
HEMOGLOBIN: 12 g/dL (ref 12.0–16.0)
MCH: 30.6 pg (ref 26.0–34.0)
MCHC: 33.2 g/dL (ref 32.0–36.0)
MCV: 92.2 fL (ref 80.0–100.0)
PLATELETS: 96 10*3/uL — AB (ref 150–440)
RBC: 3.92 MIL/uL (ref 3.80–5.20)
RDW: 13.7 % (ref 11.5–14.5)
WBC: 6.2 10*3/uL (ref 3.6–11.0)

## 2015-05-24 LAB — BASIC METABOLIC PANEL
ANION GAP: 4 — AB (ref 5–15)
BUN: 21 mg/dL — ABNORMAL HIGH (ref 6–20)
CHLORIDE: 109 mmol/L (ref 101–111)
CO2: 28 mmol/L (ref 22–32)
Calcium: 8.8 mg/dL — ABNORMAL LOW (ref 8.9–10.3)
Creatinine, Ser: 1.12 mg/dL — ABNORMAL HIGH (ref 0.44–1.00)
GFR calc non Af Amer: 42 mL/min — ABNORMAL LOW (ref >60)
GFR, EST AFRICAN AMERICAN: 49 mL/min — AB (ref >60)
Glucose, Bld: 109 mg/dL — ABNORMAL HIGH (ref 65–99)
Potassium: 3.9 mmol/L (ref 3.5–5.1)
SODIUM: 141 mmol/L (ref 135–145)

## 2015-05-24 LAB — TROPONIN I: TROPONIN I: 0.04 ng/mL — AB (ref <0.031)

## 2015-05-24 NOTE — ED Notes (Signed)
Report called to [facility] ED

## 2015-05-24 NOTE — Progress Notes (Signed)
LCSW provided 1-1 support for [name], he has agreed to call his son and daughter the minute he gets home and they can help him with things. Patient very worried about wife who will be transported to other hospital. 1-1 support provided and reveiwed safety issues for patient. He has agreed to call his children and to  Drink and eat  some food when he gets home, talked about asking friends and others to take him places if needed, [phone].  [employer] LCSW [phone]

## 2015-05-24 NOTE — ED Notes (Signed)
Recent numerous falls (6) in the last 6 months.  Fell on Sunday and hit her head, c/o head and neck pain.  Has cervical tenderness, c-collar placed on patient in triage.  Patient is not on blood thinners (only takes 81 mg asa daily).  Patient states she got dizzy or lightheaded and fell.  Alert and oriented.  Grip strength equal.  No obvious hematomas or lacerations noted, no deformities noted to head.  Pupils equal round and reactive to light.

## 2015-05-24 NOTE — ED Notes (Signed)
Pt transported to CT ?

## 2015-05-24 NOTE — ED Provider Notes (Signed)
[facility] Emergency Department Provider Note  ____________________________________________  Time seen: Approximately 410 PM  I have reviewed the triage vital signs and the nursing notes.   HISTORY  Chief Complaint Fall and Head Injury    HPI [name] is a 82 y.o. female with a history of aortic stenosis and hypothyroidism who is presenting today after a fall this past Sunday with pain and neck pain. She says that she was chasing after her cat when she tripped over her foot. She said that she fell backward hitting the back of her head onto the curb. She denies losing consciousness. Has been a meal tray since. Is complaining of pain to her left thoracic back as well as her neck at this time.Denying any weakness or numbness.   Past Medical History  Diagnosis Date  . Severe aortic stenosis   . HTN (hypertension)   . Dyslipidemia   . Hypothyroidism   . Arthritis     Patient Active Problem List   Diagnosis Date Noted  . Severe aortic stenosis   . HTN (hypertension)   . Dyslipidemia   . Hypothyroidism   . CONSTIPATION 06/20/2009    Past Surgical History  Procedure Laterality Date  . Aortic valve replacement  1999  . Total abdominal hysterectomy      Uterine cancer  . Cyst excision  20 years ago    both breast had cyst removed    Current Outpatient Rx  Name  Route  Sig  Dispense  Refill  . aspirin 81 MG tablet   Oral   Take 81 mg by mouth daily.           . calcium-vitamin D (OSCAL) 250-125 MG-UNIT per tablet   Oral   Take 1 tablet by mouth daily.   30 tablet      . cephALEXin (KEFLEX) 500 MG capsule   Oral   Take 1 capsule (500 mg total) by mouth 3 (three) times daily.   21 capsule   0   . ipratropium (ATROVENT) 0.03 % nasal spray   Each Nare   Place 2 sprays into both nostrils 2 (two) times daily.      11    . levothyroxine (SYNTHROID, LEVOTHROID) 100 MCG tablet   Oral   Take 100 mcg by mouth daily before breakfast.         . metoprolol (LOPRESSOR) 50 MG tablet   Oral   Take 50 mg by mouth 2 (two) times daily.          . Multiple Vitamin (MULTIVITAMIN) tablet   Oral   Take 1 tablet by mouth daily.           . Multiple Vitamins-Minerals (ICAPS PO)   Oral   Take by mouth daily.           . rosuvastatin (CRESTOR) 20 MG tablet   Oral   Take 20 mg by mouth daily.           Allergies Review of patient's allergies indicates no known allergies.  Family History  Problem Relation Age of Onset  . Heart disease Father   . Heart disease Mother     Social History Social History  Substance Use Topics  . Smoking status: Former Smoker    Types: Cigarettes    Quit date: 06/20/1995  . Smokeless tobacco: None  . Alcohol Use: 4.2 oz/week    7 Shots of liquor per week    Review of Systems Constitutional: No fever/chills  Eyes: No visual changes. ENT: No sore throat. Cardiovascular: Denies chest pain. Respiratory: Denies shortness of breath. Gastrointestinal: No abdominal pain.  No nausea, no vomiting.  No diarrhea.  No constipation. Genitourinary: Negative for dysuria. Musculoskeletal: Thoracic back pain. Skin: Negative for rash. Neurological: Negative for headaches, focal weakness or numbness.  10-point ROS otherwise negative.  ____________________________________________   PHYSICAL EXAM:  VITAL SIGNS: ED Triage Vitals  Enc Vitals Group     BP 05/24/15 1432 134/39 mmHg     Pulse Rate 05/24/15 1432 63     Resp 05/24/15 1432 20     Temp 05/24/15 1432 97.8 F (36.6 C)     Temp Source 05/24/15 1432 Oral     SpO2 05/24/15 1432 98 %     Weight 05/24/15 1432 120 lb (54.432 kg)     Height 05/24/15 1432 5' (1.524 m)     Head Cir --      Peak Flow --      Pain Score 05/24/15 1432 4     Pain Loc --      Pain Edu? --      Excl. in [place]? --     Constitutional: Alert and oriented. Well appearing and in no acute distress. Eyes: Conjunctivae are normal. PERRL. EOMI. Head:  Atraumatic. Nose: No congestion/rhinnorhea. Mouth/Throat: Mucous membranes are moist.  Oropharynx non-erythematous. Neck: No stridor.  In cervical collar. Tenderness diffusely to the cervical spine. No deformity or step-off palpated. Cardiovascular: Normal rate, regular rhythm. Grossly normal heart sounds.  Good peripheral circulation. Respiratory: Normal respiratory effort.  No retractions. Lungs CTAB. Gastrointestinal: Soft and nontender. No distention. No abdominal bruits. No CVA tenderness. Musculoskeletal: No lower extremity tenderness nor edema.  No joint effusions. Mild tenderness palpation over the lateral left hip. No deformity. Patient is able to actively range the hips bilaterally. Mild tenderness to left thoracic back where there is a site of abrasion about 4 x 5 cm in dimension. No deformity or crepitus. Neurologic:  Normal speech and language. No gross focal neurologic deficits are appreciated.  Skin:  Skin is warm, dry and intact. No rash noted. Psychiatric: Mood and affect are normal. Speech and behavior are normal.  ____________________________________________   LABS (all labs ordered are listed, but only abnormal results are displayed)  Labs Reviewed  BASIC METABOLIC PANEL - Abnormal; Notable for the following:    Glucose, Bld 109 (*)    BUN 21 (*)    Creatinine, Ser 1.12 (*)    Calcium 8.8 (*)    GFR calc non Af Amer 42 (*)    GFR calc Af Amer 49 (*)    Anion gap 4 (*)    All other components within normal limits  CBC - Abnormal; Notable for the following:    Platelets 96 (*)    All other components within normal limits  TROPONIN I - Abnormal; Notable for the following:    Troponin I 0.04 (*)    All other components within normal limits  URINALYSIS COMPLETEWITH MICROSCOPIC ([facility] ONLY)   ____________________________________________  EKG  ED ECG REPORT I, Doran Stabler, the attending physician, personally viewed and interpreted this ECG.   Date:  05/24/2015  EKG Time: 1440  Rate: 64  Rhythm: normal sinus rhythm  Axis: Normal axis  Intervals:none  ST&T Change: No ST segment elevation or depression. No abnormal T-wave inversion.  ____________________________________________  RADIOLOGY  No acute intracranial pathology  There is a subacute appearing fracture involving the neck of the odontoid  involving the anterior cortex. The posterior cortex is intact. Critical Value/emergent results were called by telephone at the time of interpretation on 05/24/2015 at 3:32 pm to Dr. Shirlyn Goltz , who verbally acknowledged these results. ____________________________________________   PROCEDURES  CRITICAL CARE Performed by: Doran Stabler   Total critical care time: 35 minutes  Critical care time was exclusive of separately billable procedures and treating other patients.  Critical care was necessary to treat or prevent imminent or life-threatening deterioration.  Critical care was time spent personally by me on the following activities: development of treatment plan with patient and/or surrogate as well as nursing, discussions with consultants, evaluation of patient's response to treatment, examination of patient, obtaining history from patient or surrogate, ordering and performing treatments and interventions, ordering and review of laboratory studies, ordering and review of radiographic studies, pulse oximetry and re-evaluation of patient's condition.  Call made out to [place] neurosurgery team.  ____________________________________________   INITIAL IMPRESSION / ASSESSMENT AND PLAN / ED COURSE  Pertinent labs & imaging results that were available during my care of the patient were reviewed by me and considered in my medical decision making (see chart for details).  ----------------------------------------- 4:52 PM on 05/24/2015 -----------------------------------------  Discussed the case with Dr.  Clint Lipps from [place] [facility] neurosurgical service who recommends transfer for further evaluation to the emergency department at [facility]. Discussed further with Dr. Verne Carrow of the emergency department who accepts the patient for transfer. Soon after I got a phone with Dr. Verne Carrow the patient's troponin resulted at 0.04. EKG is reassuring. Likely secondary to renal failure. This is not in the range right with expected patient to be having the troponin related to coronary disease. Troponin was initially ordered because of possible dizziness noted in the triage note. However, the patient denies this. Says that she tripped over her feet. Counseled the patient has no plan for transfer and she understands and is willing to comply. ____________________________________________   FINAL CLINICAL IMPRESSION(S) / ED DIAGNOSES  Final diagnoses:  Other closed nondisplaced odontoid fracture, initial encounter ([place])      Orbie Pyo, MD 05/24/15 1655

## 2015-05-24 NOTE — ED Notes (Signed)
Troponin 0.04. MD notified.  

## 2015-05-24 NOTE — ED Provider Notes (Signed)
Radiology called me regarding CT. CT showed subacute odontoid fracture. C collar applied in triage. I asked them to bring patient back right away.   Wandra Arthurs, MD 05/24/15 1535

## 2015-06-01 DIAGNOSIS — R296 Repeated falls: Secondary | ICD-10-CM | POA: Diagnosis not present

## 2015-06-27 ENCOUNTER — Emergency Department: Payer: PPO

## 2015-06-27 ENCOUNTER — Encounter: Payer: Self-pay | Admitting: Emergency Medicine

## 2015-06-27 ENCOUNTER — Emergency Department
Admission: EM | Admit: 2015-06-27 | Discharge: 2015-06-28 | Disposition: A | Discharge status: Home / Self Care | Payer: PPO | Attending: Emergency Medicine | Admitting: Emergency Medicine

## 2015-06-27 DIAGNOSIS — E785 Hyperlipidemia, unspecified: Secondary | ICD-10-CM | POA: Insufficient documentation

## 2015-06-27 DIAGNOSIS — Y929 Unspecified place or not applicable: Secondary | ICD-10-CM | POA: Diagnosis not present

## 2015-06-27 DIAGNOSIS — W0110XA Fall on same level from slipping, tripping and stumbling with subsequent striking against unspecified object, initial encounter: Secondary | ICD-10-CM | POA: Diagnosis not present

## 2015-06-27 DIAGNOSIS — Y998 Other external cause status: Secondary | ICD-10-CM | POA: Diagnosis not present

## 2015-06-27 DIAGNOSIS — Z79899 Other long term (current) drug therapy: Secondary | ICD-10-CM | POA: Diagnosis not present

## 2015-06-27 DIAGNOSIS — S7002XA Contusion of left hip, initial encounter: Secondary | ICD-10-CM | POA: Insufficient documentation

## 2015-06-27 DIAGNOSIS — W19XXXA Unspecified fall, initial encounter: Secondary | ICD-10-CM

## 2015-06-27 DIAGNOSIS — Z87891 Personal history of nicotine dependence: Secondary | ICD-10-CM | POA: Insufficient documentation

## 2015-06-27 DIAGNOSIS — I10 Essential (primary) hypertension: Secondary | ICD-10-CM | POA: Insufficient documentation

## 2015-06-27 DIAGNOSIS — S12040A Displaced lateral mass fracture of first cervical vertebra, initial encounter for closed fracture: Secondary | ICD-10-CM | POA: Diagnosis not present

## 2015-06-27 DIAGNOSIS — Z7982 Long term (current) use of aspirin: Secondary | ICD-10-CM | POA: Diagnosis not present

## 2015-06-27 DIAGNOSIS — E039 Hypothyroidism, unspecified: Secondary | ICD-10-CM | POA: Diagnosis not present

## 2015-06-27 DIAGNOSIS — Y939 Activity, unspecified: Secondary | ICD-10-CM | POA: Diagnosis not present

## 2015-06-27 DIAGNOSIS — R51 Headache: Secondary | ICD-10-CM | POA: Diagnosis not present

## 2015-06-27 DIAGNOSIS — I350 Nonrheumatic aortic (valve) stenosis: Secondary | ICD-10-CM | POA: Diagnosis not present

## 2015-06-27 DIAGNOSIS — S7012XA Contusion of left thigh, initial encounter: Secondary | ICD-10-CM | POA: Diagnosis not present

## 2015-06-27 DIAGNOSIS — M542 Cervicalgia: Secondary | ICD-10-CM | POA: Diagnosis not present

## 2015-06-27 DIAGNOSIS — Z952 Presence of prosthetic heart valve: Secondary | ICD-10-CM | POA: Insufficient documentation

## 2015-06-27 DIAGNOSIS — M1612 Unilateral primary osteoarthritis, left hip: Secondary | ICD-10-CM | POA: Diagnosis not present

## 2015-06-27 DIAGNOSIS — M25552 Pain in left hip: Secondary | ICD-10-CM | POA: Diagnosis not present

## 2015-06-27 DIAGNOSIS — T148XXA Other injury of unspecified body region, initial encounter: Secondary | ICD-10-CM

## 2015-06-27 DIAGNOSIS — E7800 Pure hypercholesterolemia, unspecified: Secondary | ICD-10-CM | POA: Diagnosis not present

## 2015-06-27 LAB — COMPREHENSIVE METABOLIC PANEL
ALBUMIN: 3.9 g/dL (ref 3.5–5.0)
ALT: 14 U/L (ref 14–54)
ANION GAP: 5 (ref 5–15)
AST: 27 U/L (ref 15–41)
Alkaline Phosphatase: 94 U/L (ref 38–126)
BILIRUBIN TOTAL: 0.7 mg/dL (ref 0.3–1.2)
BUN: 19 mg/dL (ref 6–20)
CO2: 27 mmol/L (ref 22–32)
Calcium: 9.1 mg/dL (ref 8.9–10.3)
Chloride: 108 mmol/L (ref 101–111)
Creatinine, Ser: 0.91 mg/dL (ref 0.44–1.00)
GFR calc Af Amer: >60 mL/min (ref >60)
GFR calc non Af Amer: 54 mL/min — ABNORMAL LOW (ref >60)
GLUCOSE: 96 mg/dL (ref 65–99)
POTASSIUM: 3.8 mmol/L (ref 3.5–5.1)
Sodium: 140 mmol/L (ref 135–145)
TOTAL PROTEIN: 7.3 g/dL (ref 6.5–8.1)

## 2015-06-27 LAB — CBC WITH DIFFERENTIAL/PLATELET
BASOS ABS: 0 10*3/uL (ref 0–0.1)
Basophils Relative: 1 %
Eosinophils Absolute: 0.1 10*3/uL (ref 0–0.7)
Eosinophils Relative: 2 %
HEMATOCRIT: 38.4 % (ref 35.0–47.0)
Hemoglobin: 12.9 g/dL (ref 12.0–16.0)
LYMPHS PCT: 18 %
Lymphs Abs: 1.2 10*3/uL (ref 1.0–3.6)
MCH: 31 pg (ref 26.0–34.0)
MCHC: 33.7 g/dL (ref 32.0–36.0)
MCV: 92 fL (ref 80.0–100.0)
Monocytes Absolute: 0.6 10*3/uL (ref 0.2–0.9)
Monocytes Relative: 9 %
NEUTROS ABS: 4.6 10*3/uL (ref 1.4–6.5)
Neutrophils Relative %: 70 %
Platelets: 104 10*3/uL — ABNORMAL LOW (ref 150–440)
RBC: 4.17 MIL/uL (ref 3.80–5.20)
RDW: 13.8 % (ref 11.5–14.5)
WBC: 6.5 10*3/uL (ref 3.6–11.0)

## 2015-06-27 LAB — URINALYSIS COMPLETE WITH MICROSCOPIC (ARMC ONLY)
BACTERIA UA: NONE SEEN
BILIRUBIN URINE: NEGATIVE
Glucose, UA: NEGATIVE mg/dL
HGB URINE DIPSTICK: NEGATIVE
Nitrite: NEGATIVE
PH: 5 (ref 5.0–8.0)
PROTEIN: NEGATIVE mg/dL
Specific Gravity, Urine: 1.021 (ref 1.005–1.030)

## 2015-06-27 NOTE — ED Provider Notes (Signed)
[facility] Emergency Department Provider Note  ____________________________________________  Time seen: Approximately 8:49 PM  I have reviewed the triage vital signs and the nursing notes.   HISTORY  Chief Complaint Fall    HPI [name] is a 81 y.o. female who presents for evaluation after a fall at home.  Initially it was reported that she was seen in the emergency department earlier todayand evaluated for a fall but it seems after review of medical records that it was actually the case that she was seen at her primary care doctor and not the emergency department.  There is no record of her being in this emergency department or another one accessible through [facility] records.  Reported that she had a mechanical fall when she tripped and landed on her left hip and buttock.  She reports pain there as well as in her head and neck.  She states that she hit her head but does not have any obvious sign of contusion.  She did not have any loss of consciousness.  She denies chest pain, shortness of breath, abdominal pain.  Reports the pain is mild to moderate and worsened with palpation and pressure to the region.   Past Medical History  Diagnosis Date  . Severe aortic stenosis   . HTN (hypertension)   . Dyslipidemia   . Hypothyroidism   . Arthritis     Patient Active Problem List   Diagnosis Date Noted  . Severe aortic stenosis   . HTN (hypertension)   . Dyslipidemia   . Hypothyroidism   . CONSTIPATION 06/20/2009    Past Surgical History  Procedure Laterality Date  . Aortic valve replacement  1999  . Total abdominal hysterectomy      Uterine cancer  . Cyst excision  20 years ago    both breast had cyst removed    Current Outpatient Rx  Name  Route  Sig  Dispense  Refill  . aspirin EC 81 MG tablet   Oral   Take 81 mg by mouth daily.         . Calcium Carbonate-Vitamin D (CALCIUM 600+D) 600-400 MG-UNIT tablet   Oral   Take 1 tablet by mouth  2 (two) times daily.         Marland Kitchen ipratropium (ATROVENT) 0.03 % nasal spray   Each Nare   Place 1-2 sprays into both nostrils 2 (two) times daily as needed for rhinitis.       11   . levothyroxine (SYNTHROID, LEVOTHROID) 100 MCG tablet   Oral   Take 100 mcg by mouth daily before breakfast.         . meloxicam (MOBIC) 7.5 MG tablet   Oral   Take 7.5 mg by mouth 2 (two) times daily after a meal.         . metoprolol (LOPRESSOR) 50 MG tablet   Oral   Take 50 mg by mouth 2 (two) times daily.          . Multiple Vitamin (MULTIVITAMIN WITH MINERALS) TABS tablet   Oral   Take 1 tablet by mouth daily.         . Multiple Vitamins-Minerals (ICAPS AREDS 2) CAPS   Oral   Take 1 capsule by mouth daily.         . rosuvastatin (CRESTOR) 20 MG tablet   Oral   Take 20 mg by mouth daily.         . cephALEXin (KEFLEX) 500 MG capsule  Oral   Take 1 capsule (500 mg total) by mouth 3 (three) times daily. Patient not taking: Reported on 05/24/2015   21 capsule   0     Allergies Review of patient's allergies indicates no known allergies.  Family History  Problem Relation Age of Onset  . Heart disease Father   . Heart disease Mother     Social History Social History  Substance Use Topics  . Smoking status: Former Smoker    Types: Cigarettes    Quit date: 06/20/1995  . Smokeless tobacco: None  . Alcohol Use: 4.2 oz/week    7 Shots of liquor per week    Review of Systems Constitutional: No fever/chills Eyes: No visual changes. ENT: No sore throat. Cardiovascular: Denies chest pain. Respiratory: Denies shortness of breath. Gastrointestinal: No abdominal pain.  No nausea, no vomiting.  No diarrhea.  No constipation. Genitourinary: Negative for dysuria. Musculoskeletal: Reports pain in head, neck, and left hip/buttock Skin: Negative for rash. Neurological: Negative for headaches, focal weakness or numbness.  10-point ROS otherwise  negative.  ____________________________________________   PHYSICAL EXAM:  VITAL SIGNS: ED Triage Vitals  Enc Vitals Group     BP 06/27/15 1900 142/42 mmHg     Pulse Rate 06/27/15 1900 86     Resp 06/27/15 1900 18     Temp 06/27/15 1900 98.7 F (37.1 C)     Temp Source 06/27/15 1900 Oral     SpO2 06/27/15 1900 97 %     Weight 06/27/15 1900 126 lb (57.153 kg)     Height 06/27/15 1900 5' (1.524 m)     Head Cir --      Peak Flow --      Pain Score --      Pain Loc --      Pain Edu? --      Excl. in [place]? --     Constitutional: Alert and oriented. Well appearing and in no acute distress. Eyes: Conjunctivae are normal. PERRL. EOMI. Head: Atraumatic. Nose: No congestion/rhinnorhea. Mouth/Throat: Mucous membranes are moist.  Oropharynx non-erythematous. Neck: No stridor.  No meningeal signs.  No cervical spine tenderness to palpation. Cardiovascular: Normal rate, regular rhythm. Good peripheral circulation. Grossly normal heart sounds.   Respiratory: Normal respiratory effort.  No retractions. Lungs CTAB. Gastrointestinal: Soft and nontender. No distention.  Musculoskeletal: No lower extremity tenderness nor edema. No gross deformities of extremities.  She has a superficial abrasion to her left hip and there is approximately a fist size firm region under the skin consistent with a hematoma but without any other obvious ecchymosis, although I suspect this will develop with time.  It is tender to palpation.  There is no spinal tenderness to palpation throughout her spine. Neurologic:  Normal speech and language. No gross focal neurologic deficits are appreciated.  Skin:  Skin is warm, dry and intact. No rash noted. Psychiatric: Mood and affect are normal. Speech and behavior are normal.  ____________________________________________   LABS (all labs ordered are listed, but only abnormal results are displayed)  Labs Reviewed  URINALYSIS COMPLETEWITH MICROSCOPIC ([place] ONLY) -  Abnormal; Notable for the following:    Color, Urine YELLOW (*)    APPearance CLEAR (*)    Ketones, ur TRACE (*)    Leukocytes, UA 1+ (*)    Squamous Epithelial / LPF 0-5 (*)    All other components within normal limits  COMPREHENSIVE METABOLIC PANEL - Abnormal; Notable for the following:    GFR calc non Af Wyvonnia Lora  54 (*)    All other components within normal limits  CBC WITH DIFFERENTIAL/PLATELET - Abnormal; Notable for the following:    Platelets 104 (*)    All other components within normal limits   ____________________________________________  EKG  None ____________________________________________  RADIOLOGY   Ct Head Wo Contrast  06/27/2015  CLINICAL DATA:  Initial evaluation for acute pain status post fall. EXAM: CT HEAD WITHOUT CONTRAST CT CERVICAL SPINE WITHOUT CONTRAST TECHNIQUE: Multidetector CT imaging of the head and cervical spine was performed following the standard protocol without intravenous contrast. Multiplanar CT image reconstructions of the cervical spine were also generated. COMPARISON:  Prior study from 05/24/2015. FINDINGS: CT HEAD FINDINGS Generalized cerebral atrophy with chronic microvascular ischemic disease present, stable from prior. No acute intracranial hemorrhage. No acute large vessel territory infarct. No mass lesion, mass effect, or midline shift. Ventricular prominence related to global parenchymal volume loss without hydrocephalus. No extra-axial fluid collection. Scalp soft tissues within normal limits. No acute abnormality about the orbits. Visualized paranasal sinuses are clear.  No mastoid effusion. Calvarium intact. CT CERVICAL SPINE FINDINGS Vertebral bodies are normally aligned with preservation of the normal cervical lordosis. Vertebral body heights maintained. Subacute fracture extending through the dens is overall similar with approximately 2 mm of distraction. Again, the posterior cortex of the dens remains intact. Chronic fracture of the  overlying anterior arch of C1 also noted, stable. No new fracture. Prevertebral soft tissues within normal limits. Multilevel degenerative spondylolysis and facet arthrosis noted, stable. No acute soft tissue abnormality. Prominent vascular calcifications about the carotid bifurcations. IMPRESSION: CT BRAIN: 1. No acute intracranial process. 2. Generalized age-related cerebral atrophy with chronic small vessel ischemic disease. CT CERVICAL SPINE: 1. No new acute traumatic injury within the cervical spine. 2. Similar appearance of subacute appearing fracture through the anterior cortex of the dens, with additional fracture of the overlying arch of C1. Electronically Signed   By: Jeannine Boga M.D.   On: 06/27/2015 22:03   Ct Cervical Spine Wo Contrast  06/27/2015  CLINICAL DATA:  Initial evaluation for acute pain status post fall. EXAM: CT HEAD WITHOUT CONTRAST CT CERVICAL SPINE WITHOUT CONTRAST TECHNIQUE: Multidetector CT imaging of the head and cervical spine was performed following the standard protocol without intravenous contrast. Multiplanar CT image reconstructions of the cervical spine were also generated. COMPARISON:  Prior study from 05/24/2015. FINDINGS: CT HEAD FINDINGS Generalized cerebral atrophy with chronic microvascular ischemic disease present, stable from prior. No acute intracranial hemorrhage. No acute large vessel territory infarct. No mass lesion, mass effect, or midline shift. Ventricular prominence related to global parenchymal volume loss without hydrocephalus. No extra-axial fluid collection. Scalp soft tissues within normal limits. No acute abnormality about the orbits. Visualized paranasal sinuses are clear.  No mastoid effusion. Calvarium intact. CT CERVICAL SPINE FINDINGS Vertebral bodies are normally aligned with preservation of the normal cervical lordosis. Vertebral body heights maintained. Subacute fracture extending through the dens is overall similar with approximately 2  mm of distraction. Again, the posterior cortex of the dens remains intact. Chronic fracture of the overlying anterior arch of C1 also noted, stable. No new fracture. Prevertebral soft tissues within normal limits. Multilevel degenerative spondylolysis and facet arthrosis noted, stable. No acute soft tissue abnormality. Prominent vascular calcifications about the carotid bifurcations. IMPRESSION: CT BRAIN: 1. No acute intracranial process. 2. Generalized age-related cerebral atrophy with chronic small vessel ischemic disease. CT CERVICAL SPINE: 1. No new acute traumatic injury within the cervical spine. 2. Similar appearance of subacute appearing  fracture through the anterior cortex of the dens, with additional fracture of the overlying arch of C1. Electronically Signed   By: Jeannine Boga M.D.   On: 06/27/2015 22:03   Dg Hip Unilat With Pelvis 2-3 Views Left  06/27/2015  CLINICAL DATA:  Left hip pain after fall EXAM: DG HIP (WITH OR WITHOUT PELVIS) 2-3V LEFT COMPARISON:  03/22/2015 left hip radiographs FINDINGS: No fracture, dislocation or suspicious focal osseous lesion. Mild-to-moderate left and mild right hip osteoarthritis, unchanged. Mild-to-moderate enthesophytosis in the bilateral pelvic girdle. Degenerative changes in the visualized lower lumbar spine. Surgical clips overlie the medial right lower abdomen and left deep pelvis. IMPRESSION: No fracture or dislocation in the left hip. Electronically Signed   By: Ilona Sorrel M.D.   On: 06/27/2015 21:45    ____________________________________________   PROCEDURES  Procedure(s) performed: None  Critical Care performed: No ____________________________________________   INITIAL IMPRESSION / ASSESSMENT AND PLAN / ED COURSE  Pertinent labs & imaging results that were available during my care of the patient were reviewed by me and considered in my medical decision making (see chart for details).  The patient is having no chest pain, no  shortness of breath, and had a mechanical fall, not syncope.  Although she complains of headache and neck pain she is ranging her neck fully and has no tenderness to palpation.  She does have what appears to be a large hematoma on her left hip but I will obtain imaging of her head, C-spine, and left hip and pelvis for further evaluation.  ----------------------------------------- 12:15 AM on 06/28/2015 -----------------------------------------  The patient's workup has been reassuring.  Her hematoma has not grown in size over 5 hours in the emergency department.  Although she has an abnormal CT scan of her C-spine, looking care everywhere and she was evaluated at [facility] for this previously including being assessed by neurosurgery; this does not appear to be a new injury and in fact was read as subacute or chronic by the radiologist.  She continues to have no focal neurological deficits at this time.  I gave the patient and her husband the reassuring news about her workup and they are eager to go home.  I gave her my usual and customary return precautions.  ____________________________________________  FINAL CLINICAL IMPRESSION(S) / ED DIAGNOSES  Final diagnoses:  Contusion, hip and thigh, left, initial encounter  Hematoma  Fall, initial encounter      NEW MEDICATIONS STARTED DURING THIS VISIT:  New Prescriptions   No medications on file      Note:  This document was prepared using Dragon voice recognition software and may include unintentional dictation errors.   Hinda Kehr, MD 06/28/15 [phone]

## 2015-06-27 NOTE — ED Notes (Signed)
Pt presents to ED after falling at home. Pt states was seen this morning in the ED for a fall and fell again this afternoon while out walking. Pt states tried to grab a pipe on the wall for support and she tripped. Pt states she hit her head. Pt reports neck and buttocks pain. Pt states fell in driveway. Denies LOC.

## 2015-06-28 NOTE — Discharge Instructions (Signed)
You have been seen in the Emergency Department (ED) today for a fall.  Your work up does not show any concerning injuries.  Please take over-the-counter ibuprofen and/or Tylenol as needed for your pain (unless you have an allergy or your doctor as told you not to take them), or take any prescribed medication as instructed.  Please consider using a heating pad or ice packs on the injured area of your left hip as this may help with the pain and swelling.  Please follow up with your doctor regarding today's Emergency Department (ED) visit and your recent fall.    Return to the ED if you have any headache, confusion, slurred speech, weakness/numbness of any arm or leg, or any increased pain.   Contusion A contusion is a deep bruise. Contusions are the result of a blunt injury to tissues and muscle fibers under the skin. The injury causes bleeding under the skin. The skin overlying the contusion may turn blue, purple, or yellow. Minor injuries will give you a painless contusion, but more severe contusions may stay painful and swollen for a few weeks.  CAUSES  This condition is usually caused by a blow, trauma, or direct force to an area of the body. SYMPTOMS  Symptoms of this condition include:  Swelling of the injured area.  Pain and tenderness in the injured area.  Discoloration. The area may have redness and then turn blue, purple, or yellow. DIAGNOSIS  This condition is diagnosed based on a physical exam and medical history. An X-ray, CT scan, or MRI may be needed to determine if there are any associated injuries, such as broken bones (fractures). TREATMENT  Specific treatment for this condition depends on what area of the body was injured. In general, the best treatment for a contusion is resting, icing, applying pressure to (compression), and elevating the injured area. This is often called the RICE strategy. Over-the-counter anti-inflammatory medicines may also be recommended for pain  control.  HOME CARE INSTRUCTIONS   Rest the injured area.  If directed, apply ice to the injured area:  Put ice in a plastic bag.  Place a towel between your skin and the bag.  Leave the ice on for 20 minutes, 2-3 times per day.  If directed, apply light compression to the injured area using an elastic bandage. Make sure the bandage is not wrapped too tightly. Remove and reapply the bandage as directed by your health care provider.  If possible, raise (elevate) the injured area above the level of your heart while you are sitting or lying down.  Take over-the-counter and prescription medicines only as told by your health care provider. SEEK MEDICAL CARE IF:  Your symptoms do not improve after several days of treatment.  Your symptoms get worse.  You have difficulty moving the injured area. SEEK IMMEDIATE MEDICAL CARE IF:   You have severe pain.  You have numbness in a hand or foot.  Your hand or foot turns pale or cold.   This information is not intended to replace advice given to you by your health care provider. Make sure you discuss any questions you have with your health care provider.   Document Released: 12/27/2004 Document Revised: 12/08/2014 Document Reviewed: 08/04/2014 Elsevier Interactive Patient Education 2016 [place].  Cryotherapy Cryotherapy means treatment with cold. Ice or gel packs can be used to reduce both pain and swelling. Ice is the most helpful within the first 24 to 48 hours after an injury or flare-up from overusing a  muscle or joint. Sprains, strains, spasms, burning pain, shooting pain, and aches can all be eased with ice. Ice can also be used when recovering from surgery. Ice is effective, has very few side effects, and is safe for most people to use. PRECAUTIONS  Ice is not a safe treatment option for people with:  Raynaud phenomenon. This is a condition affecting small blood vessels in the extremities. Exposure to cold may cause your  problems to return.  Cold hypersensitivity. There are many forms of cold hypersensitivity, including:  Cold urticaria. Red, itchy hives appear on the skin when the tissues begin to warm after being iced.  Cold erythema. This is a red, itchy rash caused by exposure to cold.  Cold hemoglobinuria. Red blood cells break down when the tissues begin to warm after being iced. The hemoglobin that carry oxygen are passed into the urine because they cannot combine with blood proteins fast enough.  Numbness or altered sensitivity in the area being iced. If you have any of the following conditions, do not use ice until you have discussed cryotherapy with your caregiver:  Heart conditions, such as arrhythmia, angina, or chronic heart disease.  High blood pressure.  Healing wounds or open skin in the area being iced.  Current infections.  Rheumatoid arthritis.  Poor circulation.  Diabetes. Ice slows the blood flow in the region it is applied. This is beneficial when trying to stop inflamed tissues from spreading irritating chemicals to surrounding tissues. However, if you expose your skin to cold temperatures for too long or without the proper protection, you can damage your skin or nerves. Watch for signs of skin damage due to cold. HOME CARE INSTRUCTIONS Follow these tips to use ice and cold packs safely.  Place a dry or damp towel between the ice and skin. A damp towel will cool the skin more quickly, so you may need to shorten the time that the ice is used.  For a more rapid response, add gentle compression to the ice.  Ice for no more than 10 to 20 minutes at a time. The bonier the area you are icing, the less time it will take to get the benefits of ice.  Check your skin after 5 minutes to make sure there are no signs of a poor response to cold or skin damage.  Rest 20 minutes or more between uses.  Once your skin is numb, you can end your treatment. You can test numbness by very  lightly touching your skin. The touch should be so light that you do not see the skin dimple from the pressure of your fingertip. When using ice, most people will feel these normal sensations in this order: cold, burning, aching, and numbness.  Do not use ice on someone who cannot communicate their responses to pain, such as small children or people with dementia. HOW TO MAKE AN ICE PACK Ice packs are the most common way to use ice therapy. Other methods include ice massage, ice baths, and cryosprays. Muscle creams that cause a cold, tingly feeling do not offer the same benefits that ice offers and should not be used as a substitute unless recommended by your caregiver. To make an ice pack, do one of the following:  Place crushed ice or a bag of frozen vegetables in a sealable plastic bag. Squeeze out the excess air. Place this bag inside another plastic bag. Slide the bag into a pillowcase or place a damp towel between your skin and the bag.  Mix 3 parts water with 1 part rubbing alcohol. Freeze the mixture in a sealable plastic bag. When you remove the mixture from the freezer, it will be slushy. Squeeze out the excess air. Place this bag inside another plastic bag. Slide the bag into a pillowcase or place a damp towel between your skin and the bag. SEEK MEDICAL CARE IF:  You develop white spots on your skin. This may give the skin a blotchy (mottled) appearance.  Your skin turns blue or pale.  Your skin becomes waxy or hard.  Your swelling gets worse. MAKE SURE YOU:   Understand these instructions.  Will watch your condition.  Will get help right away if you are not doing well or get worse.   This information is not intended to replace advice given to you by your health care provider. Make sure you discuss any questions you have with your health care provider.   Document Released: 11/13/2010 Document Revised: 04/09/2014 Document Reviewed: 11/13/2010 Elsevier Interactive Patient  Education [employer].

## 2015-06-28 NOTE — ED Notes (Signed)

## 2015-06-29 LAB — URINE CULTURE: Special Requests: NORMAL

## 2015-06-30 DIAGNOSIS — M6281 Muscle weakness (generalized): Secondary | ICD-10-CM | POA: Diagnosis not present

## 2015-06-30 DIAGNOSIS — Z9181 History of falling: Secondary | ICD-10-CM | POA: Diagnosis not present

## 2015-06-30 DIAGNOSIS — R2689 Other abnormalities of gait and mobility: Secondary | ICD-10-CM | POA: Diagnosis not present

## 2015-06-30 DIAGNOSIS — I10 Essential (primary) hypertension: Secondary | ICD-10-CM | POA: Diagnosis not present

## 2015-07-01 DIAGNOSIS — I10 Essential (primary) hypertension: Secondary | ICD-10-CM | POA: Diagnosis not present

## 2015-07-01 DIAGNOSIS — Z9181 History of falling: Secondary | ICD-10-CM | POA: Diagnosis not present

## 2015-07-01 DIAGNOSIS — R2689 Other abnormalities of gait and mobility: Secondary | ICD-10-CM | POA: Diagnosis not present

## 2015-07-01 DIAGNOSIS — M6281 Muscle weakness (generalized): Secondary | ICD-10-CM | POA: Diagnosis not present

## 2015-07-05 IMAGING — CT CT HEAD WITHOUT CONTRAST
3 of 6 series · 14 of 47 positions shown, 16 images · non-contrast
Comparison: None.

CLINICAL DATA: Fall, posterior head injury, abrasion with bleeding
controlled

EXAM:
CT HEAD WITHOUT CONTRAST
CT CERVICAL SPINE WITHOUT CONTRAST
TECHNIQUE: Multidetector CT imaging of the head and cervical spine was
performed following the standard protocol without intravenous
contrast. Multiplanar CT image reconstructions of the cervical spine
were also generated.

[Series 6: sagittal bone · sagittal · 0.32mm/px · 3 of 72 slices shown]
[im 24/72  brain]
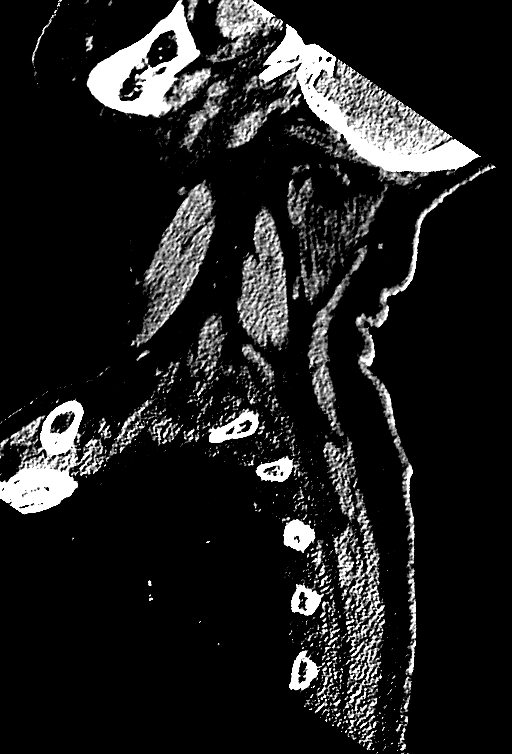
[im 36/72  brain]
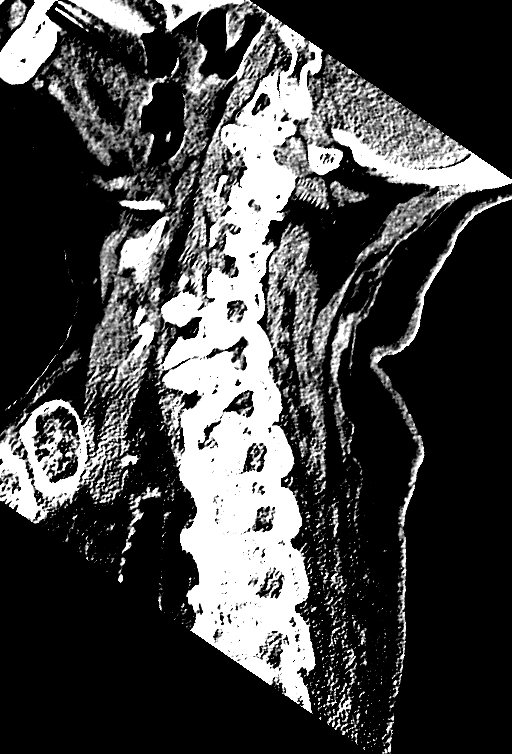
[im 48/72  brain]
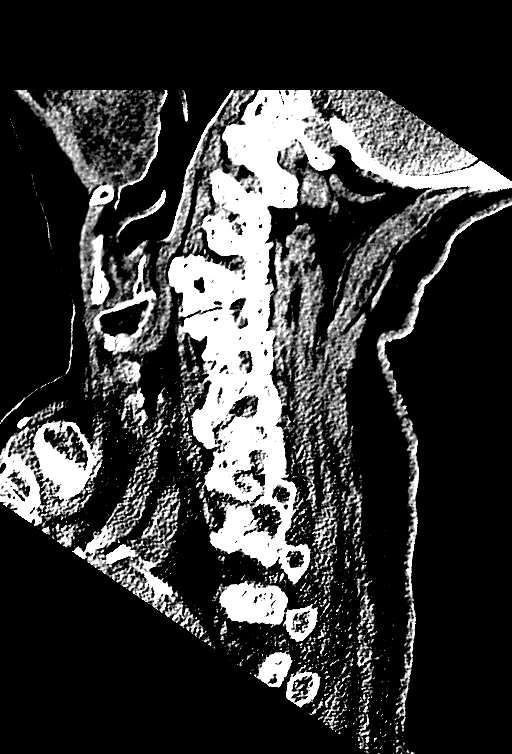

[Series 7: coronal bone · coronal · 0.35mm/px · 3 of 91 slices shown]
[im 33/91  brain]
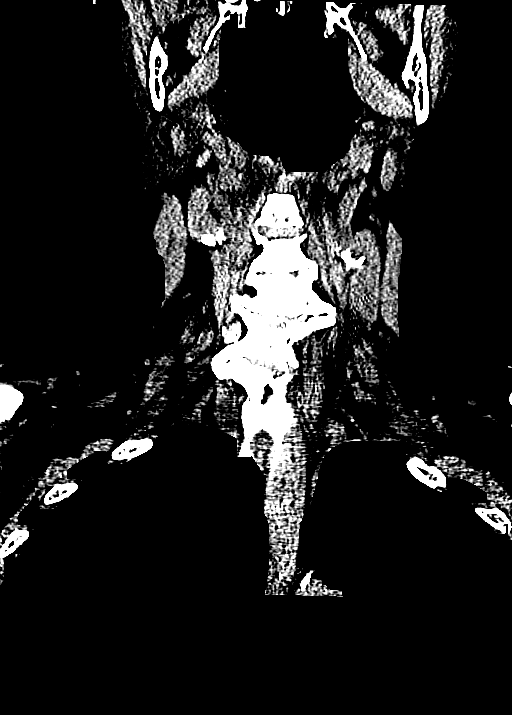
[im 41/91  brain]
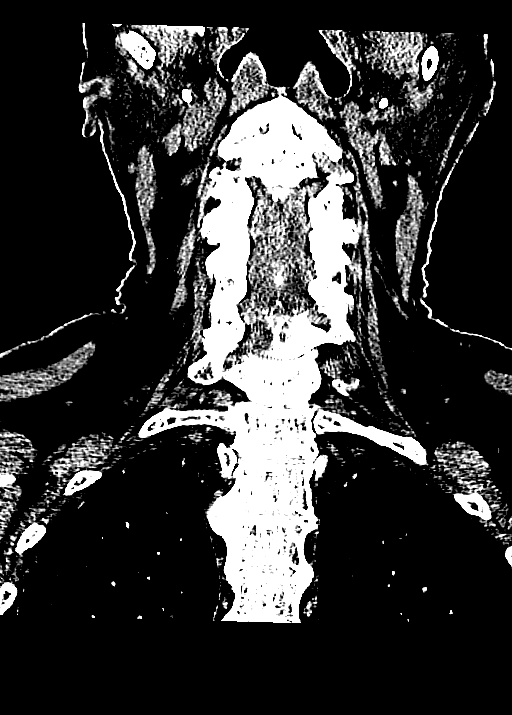
[im 50/91  brain]
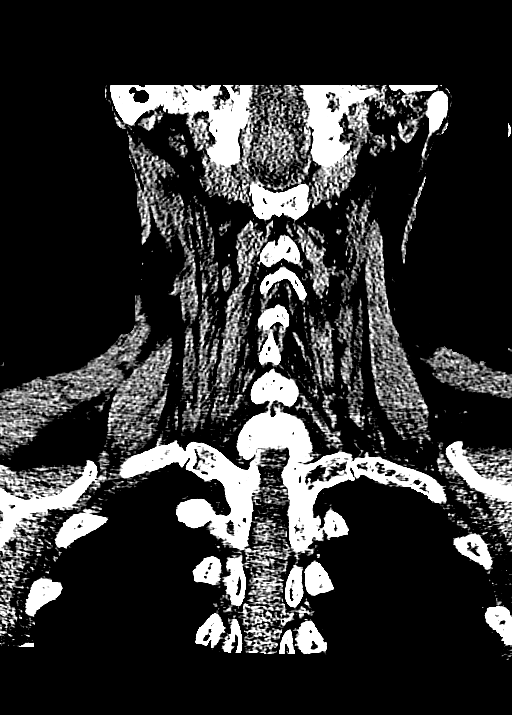

[Series 8: axial · axial · 0.31mm/px · z∈[-431,-279]mm · 8 of 111 slices shown, 10 images]
[im 12/111  brain]
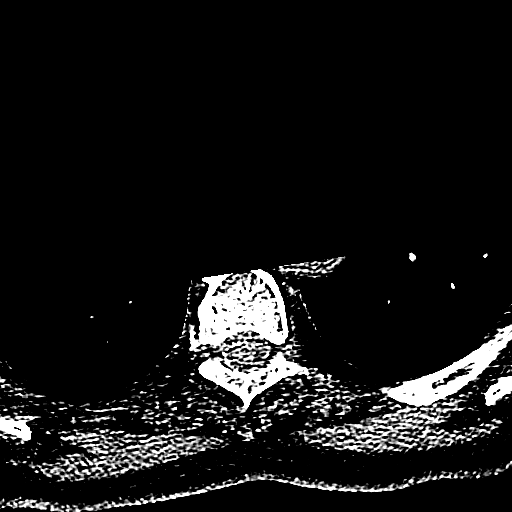
[im 12/111  bone]
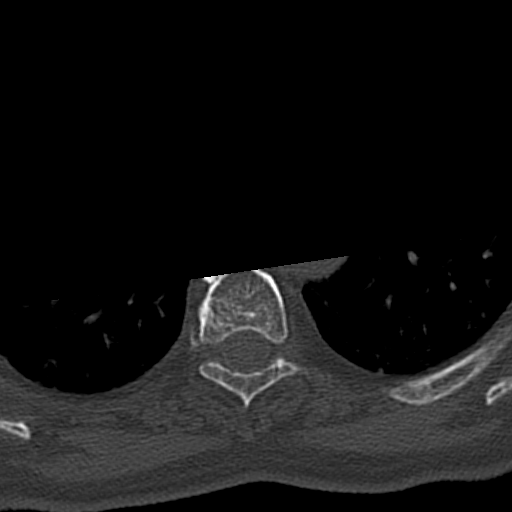
[im 23/111  brain]
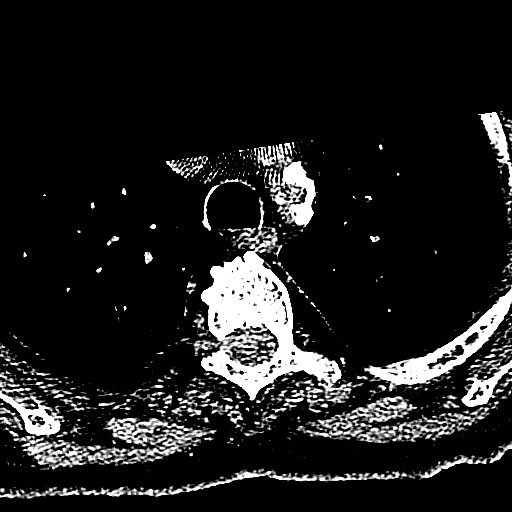
[im 34/111  brain]
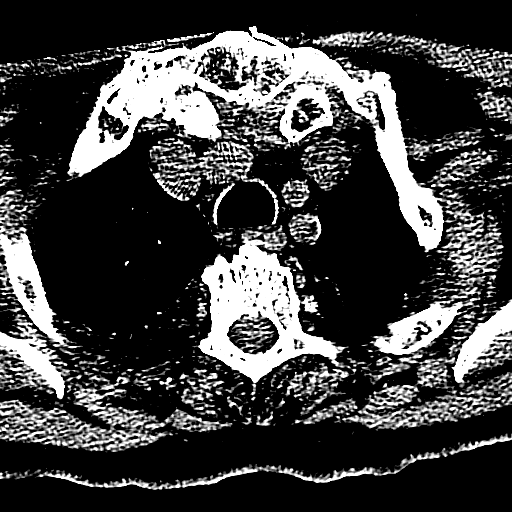
[im 45/111  brain]
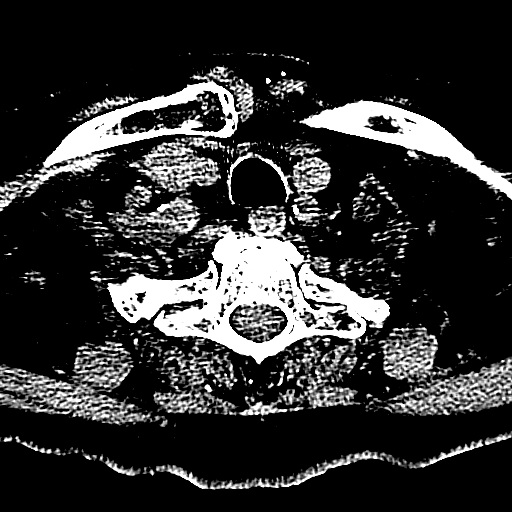
[im 67/111  brain]
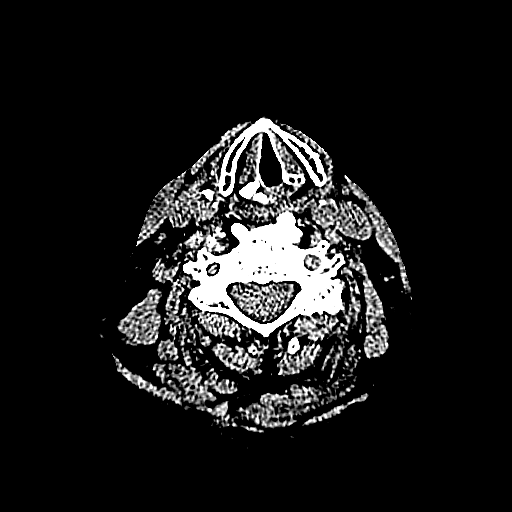
[im 67/111  bone]
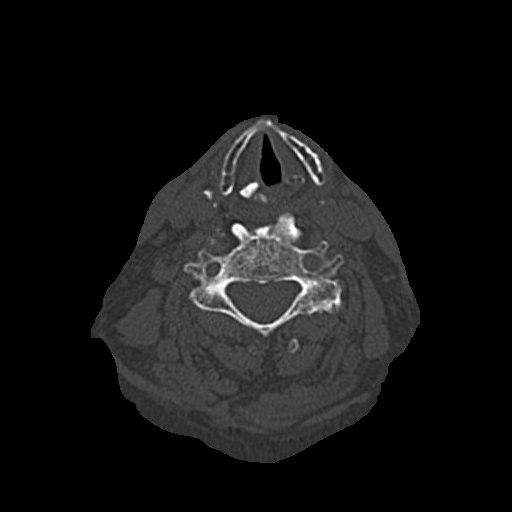
[im 78/111  brain]
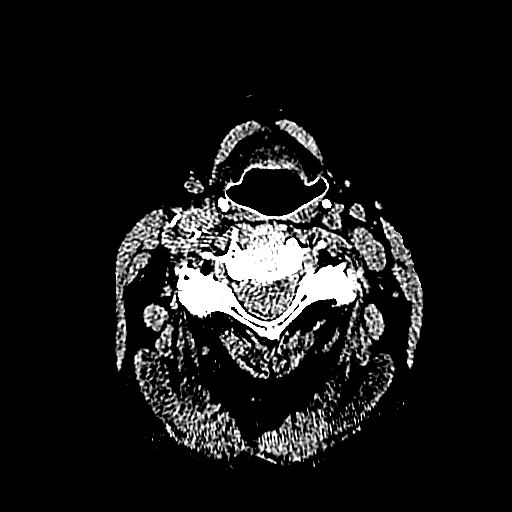
[im 89/111  brain]
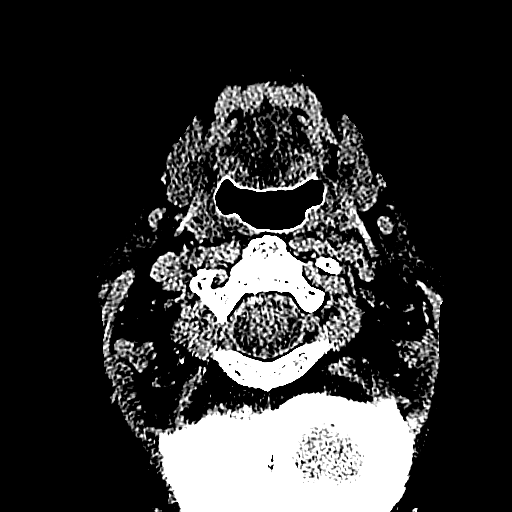
[im 100/111  brain]
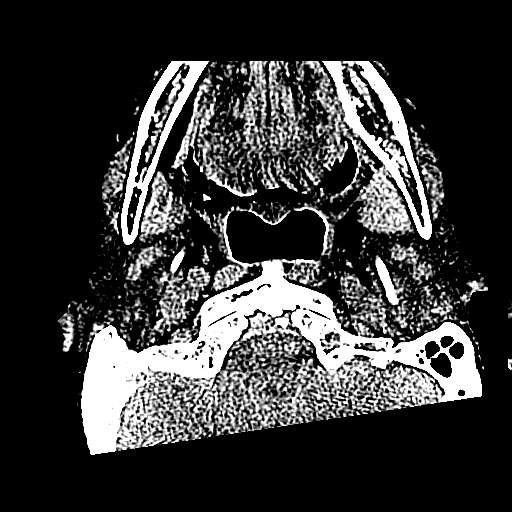

[14 of 47 positions shown; findings below may reference images not displayed]

FINDINGS: CT HEAD FINDINGS

No evidence of parenchymal hemorrhage or extra-axial fluid
collection. No mass lesion, mass effect, or midline shift.

No CT evidence of acute infarction.

Subcortical white matter and periventricular small vessel ischemic
changes. Intracranial atherosclerosis.

Global cortical and central atrophy. Secondary ventricular
prominence.

The visualized paranasal sinuses are essentially clear. The mastoid
air cells are unopacified.

Extracranial hematoma overlying the left parietal bone.

No evidence of calvarial fracture.

CT CERVICAL SPINE FINDINGS

Normal cervical lordosis.

No evidence of fracture or dislocation. Vertebral heights are
maintained. Dens appears intact.

No prevertebral soft tissue swelling.

Moderate multilevel degenerative changes.

Visualized lung apices are notable for biapical pleural parenchymal
scarring.
IMPRESSION: Extracranial hematoma overlying the left parietal bone. No evidence
of calvarial fracture.

No evidence of acute intracranial abnormality. Atrophy with small
vessel ischemic changes and intracranial atherosclerosis.

No evidence of traumatic injury to the cervical spine. Moderate
multilevel degenerative changes.

## 2015-07-13 DIAGNOSIS — E039 Hypothyroidism, unspecified: Secondary | ICD-10-CM | POA: Diagnosis not present

## 2015-07-13 DIAGNOSIS — E7800 Pure hypercholesterolemia, unspecified: Secondary | ICD-10-CM | POA: Diagnosis not present

## 2015-07-13 DIAGNOSIS — I10 Essential (primary) hypertension: Secondary | ICD-10-CM | POA: Diagnosis not present

## 2015-07-13 DIAGNOSIS — R634 Abnormal weight loss: Secondary | ICD-10-CM | POA: Diagnosis not present

## 2015-07-19 DIAGNOSIS — Z9181 History of falling: Secondary | ICD-10-CM | POA: Diagnosis not present

## 2015-07-19 DIAGNOSIS — R2689 Other abnormalities of gait and mobility: Secondary | ICD-10-CM | POA: Diagnosis not present

## 2015-07-19 DIAGNOSIS — I10 Essential (primary) hypertension: Secondary | ICD-10-CM | POA: Diagnosis not present

## 2015-07-19 DIAGNOSIS — M6281 Muscle weakness (generalized): Secondary | ICD-10-CM | POA: Diagnosis not present

## 2015-07-21 DIAGNOSIS — Z9181 History of falling: Secondary | ICD-10-CM | POA: Diagnosis not present

## 2015-07-21 DIAGNOSIS — I10 Essential (primary) hypertension: Secondary | ICD-10-CM | POA: Diagnosis not present

## 2015-07-21 DIAGNOSIS — M6281 Muscle weakness (generalized): Secondary | ICD-10-CM | POA: Diagnosis not present

## 2015-07-21 DIAGNOSIS — R2689 Other abnormalities of gait and mobility: Secondary | ICD-10-CM | POA: Diagnosis not present

## 2015-07-22 DIAGNOSIS — M6281 Muscle weakness (generalized): Secondary | ICD-10-CM | POA: Diagnosis not present

## 2015-07-22 DIAGNOSIS — I10 Essential (primary) hypertension: Secondary | ICD-10-CM | POA: Diagnosis not present

## 2015-07-22 DIAGNOSIS — R2689 Other abnormalities of gait and mobility: Secondary | ICD-10-CM | POA: Diagnosis not present

## 2015-07-22 DIAGNOSIS — Z9181 History of falling: Secondary | ICD-10-CM | POA: Diagnosis not present

## 2015-07-26 DIAGNOSIS — H353131 Nonexudative age-related macular degeneration, bilateral, early dry stage: Secondary | ICD-10-CM | POA: Diagnosis not present

## 2015-08-03 DIAGNOSIS — R634 Abnormal weight loss: Secondary | ICD-10-CM | POA: Diagnosis not present

## 2015-08-03 DIAGNOSIS — I10 Essential (primary) hypertension: Secondary | ICD-10-CM | POA: Diagnosis not present

## 2015-08-03 DIAGNOSIS — E039 Hypothyroidism, unspecified: Secondary | ICD-10-CM | POA: Diagnosis not present

## 2015-08-03 DIAGNOSIS — E7800 Pure hypercholesterolemia, unspecified: Secondary | ICD-10-CM | POA: Diagnosis not present

## 2015-08-04 DIAGNOSIS — R2689 Other abnormalities of gait and mobility: Secondary | ICD-10-CM | POA: Diagnosis not present

## 2015-08-04 DIAGNOSIS — I10 Essential (primary) hypertension: Secondary | ICD-10-CM | POA: Diagnosis not present

## 2015-08-04 DIAGNOSIS — M6281 Muscle weakness (generalized): Secondary | ICD-10-CM | POA: Diagnosis not present

## 2015-08-04 DIAGNOSIS — Z9181 History of falling: Secondary | ICD-10-CM | POA: Diagnosis not present

## 2015-08-14 DIAGNOSIS — R630 Anorexia: Secondary | ICD-10-CM | POA: Diagnosis not present

## 2015-08-14 DIAGNOSIS — A084 Viral intestinal infection, unspecified: Secondary | ICD-10-CM | POA: Diagnosis not present

## 2015-08-15 DIAGNOSIS — R1110 Vomiting, unspecified: Secondary | ICD-10-CM | POA: Diagnosis not present

## 2015-09-14 DIAGNOSIS — I10 Essential (primary) hypertension: Secondary | ICD-10-CM | POA: Diagnosis not present

## 2015-09-14 DIAGNOSIS — R634 Abnormal weight loss: Secondary | ICD-10-CM | POA: Diagnosis not present

## 2015-09-14 DIAGNOSIS — E039 Hypothyroidism, unspecified: Secondary | ICD-10-CM | POA: Diagnosis not present

## 2015-09-14 DIAGNOSIS — M47812 Spondylosis without myelopathy or radiculopathy, cervical region: Secondary | ICD-10-CM | POA: Diagnosis not present

## 2015-09-14 DIAGNOSIS — E7800 Pure hypercholesterolemia, unspecified: Secondary | ICD-10-CM | POA: Diagnosis not present

## 2015-09-14 DIAGNOSIS — M542 Cervicalgia: Secondary | ICD-10-CM | POA: Diagnosis not present

## 2016-01-17 DIAGNOSIS — I10 Essential (primary) hypertension: Secondary | ICD-10-CM | POA: Diagnosis not present

## 2016-01-17 DIAGNOSIS — Z23 Encounter for immunization: Secondary | ICD-10-CM | POA: Diagnosis not present

## 2016-01-17 DIAGNOSIS — E7800 Pure hypercholesterolemia, unspecified: Secondary | ICD-10-CM | POA: Diagnosis not present

## 2016-01-17 DIAGNOSIS — R634 Abnormal weight loss: Secondary | ICD-10-CM | POA: Diagnosis not present

## 2016-01-17 DIAGNOSIS — G609 Hereditary and idiopathic neuropathy, unspecified: Secondary | ICD-10-CM | POA: Diagnosis not present

## 2016-01-17 DIAGNOSIS — E039 Hypothyroidism, unspecified: Secondary | ICD-10-CM | POA: Diagnosis not present

## 2016-01-17 DIAGNOSIS — R296 Repeated falls: Secondary | ICD-10-CM | POA: Diagnosis not present

## 2016-01-17 DIAGNOSIS — Z0000 Encounter for general adult medical examination without abnormal findings: Secondary | ICD-10-CM | POA: Diagnosis not present

## 2016-01-31 DIAGNOSIS — E079 Disorder of thyroid, unspecified: Secondary | ICD-10-CM | POA: Diagnosis not present

## 2016-01-31 DIAGNOSIS — M1990 Unspecified osteoarthritis, unspecified site: Secondary | ICD-10-CM | POA: Diagnosis not present

## 2016-01-31 DIAGNOSIS — I351 Nonrheumatic aortic (valve) insufficiency: Secondary | ICD-10-CM | POA: Diagnosis not present

## 2016-01-31 DIAGNOSIS — Z952 Presence of prosthetic heart valve: Secondary | ICD-10-CM | POA: Diagnosis not present

## 2016-01-31 DIAGNOSIS — E784 Other hyperlipidemia: Secondary | ICD-10-CM | POA: Diagnosis not present

## 2016-01-31 DIAGNOSIS — I10 Essential (primary) hypertension: Secondary | ICD-10-CM | POA: Diagnosis not present

## 2016-01-31 DIAGNOSIS — R296 Repeated falls: Secondary | ICD-10-CM | POA: Diagnosis not present

## 2016-01-31 DIAGNOSIS — R011 Cardiac murmur, unspecified: Secondary | ICD-10-CM | POA: Diagnosis not present

## 2016-02-01 ENCOUNTER — Other Ambulatory Visit: Payer: Self-pay | Admitting: Internal Medicine

## 2016-02-01 DIAGNOSIS — R296 Repeated falls: Secondary | ICD-10-CM

## 2016-02-09 ENCOUNTER — Ambulatory Visit: Payer: PPO

## 2016-02-09 ENCOUNTER — Ambulatory Visit
Admission: RE | Admit: 2016-02-09 | Discharge: 2016-02-09 | Disposition: A | Discharge status: Home / Self Care | Payer: PPO | Source: Ambulatory Visit | Attending: Internal Medicine | Admitting: Internal Medicine

## 2016-02-09 DIAGNOSIS — S12101S Unspecified nondisplaced fracture of second cervical vertebra, sequela: Secondary | ICD-10-CM | POA: Insufficient documentation

## 2016-02-09 DIAGNOSIS — R42 Dizziness and giddiness: Secondary | ICD-10-CM | POA: Diagnosis not present

## 2016-02-09 DIAGNOSIS — G319 Degenerative disease of nervous system, unspecified: Secondary | ICD-10-CM | POA: Diagnosis not present

## 2016-02-09 DIAGNOSIS — R296 Repeated falls: Secondary | ICD-10-CM | POA: Diagnosis not present

## 2016-02-09 DIAGNOSIS — M542 Cervicalgia: Secondary | ICD-10-CM | POA: Diagnosis not present

## 2016-02-09 DIAGNOSIS — R9082 White matter disease, unspecified: Secondary | ICD-10-CM | POA: Diagnosis not present

## 2016-03-02 DIAGNOSIS — H1859 Other hereditary corneal dystrophies: Secondary | ICD-10-CM | POA: Diagnosis not present

## 2016-03-02 DIAGNOSIS — H353131 Nonexudative age-related macular degeneration, bilateral, early dry stage: Secondary | ICD-10-CM | POA: Diagnosis not present

## 2016-03-05 DIAGNOSIS — M1990 Unspecified osteoarthritis, unspecified site: Secondary | ICD-10-CM | POA: Diagnosis not present

## 2016-03-05 DIAGNOSIS — R011 Cardiac murmur, unspecified: Secondary | ICD-10-CM | POA: Diagnosis not present

## 2016-03-05 DIAGNOSIS — E079 Disorder of thyroid, unspecified: Secondary | ICD-10-CM | POA: Diagnosis not present

## 2016-03-05 DIAGNOSIS — R634 Abnormal weight loss: Secondary | ICD-10-CM | POA: Diagnosis not present

## 2016-03-05 DIAGNOSIS — R42 Dizziness and giddiness: Secondary | ICD-10-CM | POA: Diagnosis not present

## 2016-03-05 DIAGNOSIS — E784 Other hyperlipidemia: Secondary | ICD-10-CM | POA: Diagnosis not present

## 2016-03-05 DIAGNOSIS — Z952 Presence of prosthetic heart valve: Secondary | ICD-10-CM | POA: Diagnosis not present

## 2016-03-05 DIAGNOSIS — R270 Ataxia, unspecified: Secondary | ICD-10-CM | POA: Diagnosis not present

## 2016-03-05 DIAGNOSIS — R296 Repeated falls: Secondary | ICD-10-CM | POA: Diagnosis not present

## 2016-03-05 DIAGNOSIS — I350 Nonrheumatic aortic (valve) stenosis: Secondary | ICD-10-CM | POA: Diagnosis not present

## 2016-03-05 DIAGNOSIS — I10 Essential (primary) hypertension: Secondary | ICD-10-CM | POA: Diagnosis not present

## 2016-03-23 DIAGNOSIS — I350 Nonrheumatic aortic (valve) stenosis: Secondary | ICD-10-CM | POA: Diagnosis not present

## 2016-03-23 DIAGNOSIS — R011 Cardiac murmur, unspecified: Secondary | ICD-10-CM | POA: Diagnosis not present

## 2016-03-23 DIAGNOSIS — R42 Dizziness and giddiness: Secondary | ICD-10-CM | POA: Diagnosis not present

## 2016-04-12 DIAGNOSIS — I10 Essential (primary) hypertension: Secondary | ICD-10-CM | POA: Diagnosis not present

## 2016-04-12 DIAGNOSIS — R296 Repeated falls: Secondary | ICD-10-CM | POA: Diagnosis not present

## 2016-04-12 DIAGNOSIS — E039 Hypothyroidism, unspecified: Secondary | ICD-10-CM | POA: Diagnosis not present

## 2016-04-12 DIAGNOSIS — E7800 Pure hypercholesterolemia, unspecified: Secondary | ICD-10-CM | POA: Diagnosis not present

## 2016-04-16 ENCOUNTER — Encounter: Payer: Self-pay | Admitting: Intensive Care

## 2016-04-16 ENCOUNTER — Emergency Department
Admission: EM | Admit: 2016-04-16 | Discharge: 2016-04-16 | Disposition: A | Discharge status: Home / Self Care | Payer: PPO | Attending: Emergency Medicine | Admitting: Emergency Medicine

## 2016-04-16 DIAGNOSIS — Y998 Other external cause status: Secondary | ICD-10-CM | POA: Diagnosis not present

## 2016-04-16 DIAGNOSIS — E039 Hypothyroidism, unspecified: Secondary | ICD-10-CM | POA: Insufficient documentation

## 2016-04-16 DIAGNOSIS — Y929 Unspecified place or not applicable: Secondary | ICD-10-CM | POA: Diagnosis not present

## 2016-04-16 DIAGNOSIS — W5501XA Bitten by cat, initial encounter: Secondary | ICD-10-CM | POA: Diagnosis not present

## 2016-04-16 DIAGNOSIS — S61452A Open bite of left hand, initial encounter: Secondary | ICD-10-CM | POA: Diagnosis not present

## 2016-04-16 DIAGNOSIS — L03114 Cellulitis of left upper limb: Secondary | ICD-10-CM | POA: Diagnosis not present

## 2016-04-16 DIAGNOSIS — Z87891 Personal history of nicotine dependence: Secondary | ICD-10-CM | POA: Insufficient documentation

## 2016-04-16 DIAGNOSIS — Z7982 Long term (current) use of aspirin: Secondary | ICD-10-CM | POA: Diagnosis not present

## 2016-04-16 DIAGNOSIS — Z79899 Other long term (current) drug therapy: Secondary | ICD-10-CM | POA: Insufficient documentation

## 2016-04-16 DIAGNOSIS — S51852A Open bite of left forearm, initial encounter: Secondary | ICD-10-CM | POA: Diagnosis not present

## 2016-04-16 DIAGNOSIS — I10 Essential (primary) hypertension: Secondary | ICD-10-CM | POA: Insufficient documentation

## 2016-04-16 DIAGNOSIS — Y9389 Activity, other specified: Secondary | ICD-10-CM | POA: Diagnosis not present

## 2016-04-16 LAB — CBC WITH DIFFERENTIAL/PLATELET
Basophils Absolute: 0 10*3/uL (ref 0–0.1)
Basophils Relative: 0 %
Eosinophils Absolute: 0.3 10*3/uL (ref 0–0.7)
Eosinophils Relative: 4 %
HEMATOCRIT: 37.7 % (ref 35.0–47.0)
HEMOGLOBIN: 12.8 g/dL (ref 12.0–16.0)
LYMPHS ABS: 0.9 10*3/uL — AB (ref 1.0–3.6)
Lymphocytes Relative: 15 %
MCH: 31.6 pg (ref 26.0–34.0)
MCHC: 34 g/dL (ref 32.0–36.0)
MCV: 93 fL (ref 80.0–100.0)
MONO ABS: 0.5 10*3/uL (ref 0.2–0.9)
Monocytes Relative: 8 %
NEUTROS ABS: 4.4 10*3/uL (ref 1.4–6.5)
NEUTROS PCT: 73 %
Platelets: 102 10*3/uL — ABNORMAL LOW (ref 150–440)
RBC: 4.05 MIL/uL (ref 3.80–5.20)
RDW: 13.5 % (ref 11.5–14.5)
WBC: 6.1 10*3/uL (ref 3.6–11.0)

## 2016-04-16 LAB — COMPREHENSIVE METABOLIC PANEL
ALBUMIN: 3.9 g/dL (ref 3.5–5.0)
ALT: 12 U/L — AB (ref 14–54)
ANION GAP: 5 (ref 5–15)
AST: 27 U/L (ref 15–41)
Alkaline Phosphatase: 66 U/L (ref 38–126)
BUN: 20 mg/dL (ref 6–20)
CHLORIDE: 107 mmol/L (ref 101–111)
CO2: 28 mmol/L (ref 22–32)
Calcium: 9.2 mg/dL (ref 8.9–10.3)
Creatinine, Ser: 1.12 mg/dL — ABNORMAL HIGH (ref 0.44–1.00)
GFR calc non Af Amer: 42 mL/min — ABNORMAL LOW (ref >60)
GFR, EST AFRICAN AMERICAN: 49 mL/min — AB (ref >60)
GLUCOSE: 113 mg/dL — AB (ref 65–99)
Potassium: 3.8 mmol/L (ref 3.5–5.1)
SODIUM: 140 mmol/L (ref 135–145)
Total Bilirubin: 0.8 mg/dL (ref 0.3–1.2)
Total Protein: 7.4 g/dL (ref 6.5–8.1)

## 2016-04-16 LAB — LACTIC ACID, PLASMA: LACTIC ACID, VENOUS: 1.3 mmol/L (ref 0.5–1.9)

## 2016-04-16 MED ORDER — AMOXICILLIN-POT CLAVULANATE 875-125 MG PO TABS
1.0000 | ORAL_TABLET | Freq: Two times a day (BID) | ORAL | 0 refills | Status: AC
Start: 2016-04-16 — End: 2016-04-26

## 2016-04-16 MED ORDER — AMOXICILLIN-POT CLAVULANATE 875-125 MG PO TABS
1.0000 | ORAL_TABLET | Freq: Once | ORAL | Status: AC
  Administered 2016-04-16: 1 via ORAL
  Filled 2016-04-16: qty 1

## 2016-04-16 NOTE — Discharge Instructions (Signed)
Please take your antibiotics for their entire prescribed course. Please follow-up with your primary care doctor in 24-48 hours for recheck/reevaluation. Return to the emergency department for any increased redness, pain, fever or vomiting.

## 2016-04-16 NOTE — ED Triage Notes (Addendum)
Patient presents to ER with L hand swelling and redness noted from L hand up L arm. Rash also present all over patients body since the bite. Patient reports her cats shots are up to date. States "I was playing with my cat and she bit down on my hand and would not let go a couple of days ago". A&O x4

## 2016-04-16 NOTE — ED Provider Notes (Signed)
[facility] Emergency Department Provider Note  Time seen: 4:20 PM  I have reviewed the triage vital signs and the nursing notes.   HISTORY  Chief Complaint Animal Bite    HPI [name] is a 83 y.o. female with a past medical history of arthritis, hypertension, aortic valve replacement, presents to the emergency department for redness of her left hand. According to the patient approximately 36-40 hours agoshe was playing with her cat when it bit down on her left hand. Patient states the cat's vaccines including rabies are up to date. She states today she noticed redness of the hands starting to streak up her left arm so she came to the ER for evaluation. Denies any nausea or vomiting. Denies any fever. Patient states mild discomfort in the left hand.  Past Medical History:  Diagnosis Date  . Arthritis   . Dyslipidemia   . HTN (hypertension)   . Hypothyroidism   . Severe aortic stenosis     Patient Active Problem List   Diagnosis Date Noted  . Severe aortic stenosis   . HTN (hypertension)   . Dyslipidemia   . Hypothyroidism   . CONSTIPATION 06/20/2009    Past Surgical History:  Procedure Laterality Date  . AORTIC VALVE REPLACEMENT  1999  . CYST EXCISION  20 years ago   both breast had cyst removed  . TOTAL ABDOMINAL HYSTERECTOMY     Uterine cancer    Prior to Admission medications   Medication Sig Start Date End Date Taking? Authorizing Provider  aspirin EC 81 MG tablet Take 81 mg by mouth daily.    Historical Provider, MD  Calcium Carbonate-Vitamin D (CALCIUM 600+D) 600-400 MG-UNIT tablet Take 1 tablet by mouth 2 (two) times daily.    Historical Provider, MD  cephALEXin (KEFLEX) 500 MG capsule Take 1 capsule (500 mg total) by mouth 3 (three) times daily. Patient not taking: Reported on 05/24/2015 03/22/15   Carrie Mew, MD  ipratropium (ATROVENT) 0.03 % nasal spray Place 1-2 sprays into both nostrils 2 (two) times daily as needed for  rhinitis.     Historical Provider, MD  levothyroxine (SYNTHROID, LEVOTHROID) 100 MCG tablet Take 100 mcg by mouth daily before breakfast.    Historical Provider, MD  meloxicam (MOBIC) 7.5 MG tablet Take 7.5 mg by mouth 2 (two) times daily after a meal.    Historical Provider, MD  metoprolol (LOPRESSOR) 50 MG tablet Take 50 mg by mouth 2 (two) times daily.     Peter M Martinique, MD  Multiple Vitamin (MULTIVITAMIN WITH MINERALS) TABS tablet Take 1 tablet by mouth daily.    Historical Provider, MD  Multiple Vitamins-Minerals (ICAPS AREDS 2) CAPS Take 1 capsule by mouth daily.    Historical Provider, MD  rosuvastatin (CRESTOR) 20 MG tablet Take 20 mg by mouth daily.    Historical Provider, MD    No Known Allergies  Family History  Problem Relation Age of Onset  . Heart disease Father   . Heart disease Mother     Social History Social History  Substance Use Topics  . Smoking status: Former Smoker    Types: Cigarettes    Quit date: 06/20/1995  . Smokeless tobacco: Never Used  . Alcohol use Yes     Comment: Occ    Review of Systems Constitutional: Negative for fever. Cardiovascular: Negative for chest pain. Respiratory: Negative for shortness of breath. Gastrointestinal: Negative for abdominal pain Musculoskeletal: Negative for back pain. Skin: Redness of the left Hand  Neurological: Negative for headache 10-point ROS otherwise negative.  ____________________________________________   PHYSICAL EXAM:  VITAL SIGNS: ED Triage Vitals  Enc Vitals Group     BP 04/16/16 1331 113/65     Pulse Rate 04/16/16 1331 68     Resp 04/16/16 1331 16     Temp 04/16/16 1331 97.8 F (36.6 C)     Temp Source 04/16/16 1331 Oral     SpO2 04/16/16 1331 98 %     Weight 04/16/16 1333 124 lb (56.2 kg)     Height 04/16/16 1333 5' (1.524 m)     Head Circumference --      Peak Flow --      Pain Score --      Pain Loc --      Pain Edu? --      Excl. in [place]? --     Constitutional: Alert and  oriented. Well appearing and in no distress. Eyes: Normal exam ENT   Head: Normocephalic and atraumatic   Mouth/Throat: Mucous membranes are moist. Cardiovascular: Normal rate, regular rhythm. No murmur Respiratory: Normal respiratory effort without tachypnea nor retractions. Breath sounds are clear Gastrointestinal: Soft and nontender. No distention.   Musculoskeletal: Mild tenderness of the dorsal aspect of the left hand where there is a puncture wound. Mild lymphatic streaking. Most consistent with a mild cellulitis. Neurovascularly intact distally. Neurologic:  Normal speech and language. No gross focal neurologic deficits Skin:  Skin is warm, dry and intact.  Psychiatric: Mood and affect are normal.  ____________________________________________   INITIAL IMPRESSION / ASSESSMENT AND PLAN / ED COURSE  Pertinent labs & imaging results that were available during my care of the patient were reviewed by me and considered in my medical decision making (see chart for details).  The patient presents to the emergency department with left hand cellulitis, mild cellulitis at this time, minimal to no induration. We will start the patient on Augmentin and have her follow up with her primary care doctor in 48 hours for recheck. I discussed return precautions for any increased redness fever or vomiting. Patient is agreeable.  ____________________________________________   FINAL CLINICAL IMPRESSION(S) / ED DIAGNOSES  cat Bite Cellulitis    Harvest Dark, MD 04/16/16 1701

## 2016-04-17 DIAGNOSIS — L282 Other prurigo: Secondary | ICD-10-CM | POA: Diagnosis not present

## 2016-04-17 DIAGNOSIS — W5501XD Bitten by cat, subsequent encounter: Secondary | ICD-10-CM | POA: Diagnosis not present

## 2016-04-17 DIAGNOSIS — S61002A Unspecified open wound of left thumb without damage to nail, initial encounter: Secondary | ICD-10-CM | POA: Diagnosis not present

## 2016-04-20 DIAGNOSIS — W5501XD Bitten by cat, subsequent encounter: Secondary | ICD-10-CM | POA: Diagnosis not present

## 2016-04-20 DIAGNOSIS — S61002D Unspecified open wound of left thumb without damage to nail, subsequent encounter: Secondary | ICD-10-CM | POA: Diagnosis not present

## 2016-05-14 DIAGNOSIS — K222 Esophageal obstruction: Secondary | ICD-10-CM | POA: Diagnosis not present

## 2016-05-14 DIAGNOSIS — R42 Dizziness and giddiness: Secondary | ICD-10-CM | POA: Diagnosis not present

## 2016-05-14 DIAGNOSIS — R296 Repeated falls: Secondary | ICD-10-CM | POA: Diagnosis not present

## 2016-05-14 DIAGNOSIS — E784 Other hyperlipidemia: Secondary | ICD-10-CM | POA: Diagnosis not present

## 2016-05-14 DIAGNOSIS — I350 Nonrheumatic aortic (valve) stenosis: Secondary | ICD-10-CM | POA: Diagnosis not present

## 2016-05-14 DIAGNOSIS — R011 Cardiac murmur, unspecified: Secondary | ICD-10-CM | POA: Diagnosis not present

## 2016-05-14 DIAGNOSIS — R634 Abnormal weight loss: Secondary | ICD-10-CM | POA: Diagnosis not present

## 2016-05-14 DIAGNOSIS — E079 Disorder of thyroid, unspecified: Secondary | ICD-10-CM | POA: Diagnosis not present

## 2016-05-14 DIAGNOSIS — R4702 Dysphasia: Secondary | ICD-10-CM | POA: Diagnosis not present

## 2016-05-14 DIAGNOSIS — I10 Essential (primary) hypertension: Secondary | ICD-10-CM | POA: Diagnosis not present

## 2016-05-14 DIAGNOSIS — R270 Ataxia, unspecified: Secondary | ICD-10-CM | POA: Diagnosis not present

## 2016-05-14 DIAGNOSIS — Z952 Presence of prosthetic heart valve: Secondary | ICD-10-CM | POA: Diagnosis not present

## 2016-05-18 DIAGNOSIS — R2689 Other abnormalities of gait and mobility: Secondary | ICD-10-CM | POA: Diagnosis not present

## 2016-05-18 DIAGNOSIS — R4189 Other symptoms and signs involving cognitive functions and awareness: Secondary | ICD-10-CM | POA: Diagnosis not present

## 2016-05-22 ENCOUNTER — Other Ambulatory Visit: Payer: Self-pay | Admitting: Nurse Practitioner

## 2016-05-22 DIAGNOSIS — R1310 Dysphagia, unspecified: Secondary | ICD-10-CM | POA: Diagnosis not present

## 2016-05-22 DIAGNOSIS — R634 Abnormal weight loss: Secondary | ICD-10-CM | POA: Diagnosis not present

## 2016-05-24 ENCOUNTER — Ambulatory Visit: Payer: PPO | Attending: Nurse Practitioner

## 2016-06-04 DIAGNOSIS — E538 Deficiency of other specified B group vitamins: Secondary | ICD-10-CM | POA: Diagnosis not present

## 2016-06-09 DIAGNOSIS — F05 Delirium due to known physiological condition: Secondary | ICD-10-CM | POA: Diagnosis not present

## 2016-06-10 ENCOUNTER — Emergency Department: Payer: PPO

## 2016-06-10 ENCOUNTER — Emergency Department
Admission: EM | Admit: 2016-06-10 | Discharge: 2016-06-10 | Disposition: A | Discharge status: Home / Self Care | Payer: PPO | Attending: Emergency Medicine | Admitting: Emergency Medicine

## 2016-06-10 ENCOUNTER — Encounter: Payer: Self-pay | Admitting: *Deleted

## 2016-06-10 DIAGNOSIS — E039 Hypothyroidism, unspecified: Secondary | ICD-10-CM | POA: Diagnosis not present

## 2016-06-10 DIAGNOSIS — R4182 Altered mental status, unspecified: Secondary | ICD-10-CM

## 2016-06-10 DIAGNOSIS — Z79899 Other long term (current) drug therapy: Secondary | ICD-10-CM | POA: Diagnosis not present

## 2016-06-10 DIAGNOSIS — I10 Essential (primary) hypertension: Secondary | ICD-10-CM | POA: Diagnosis not present

## 2016-06-10 DIAGNOSIS — Z7982 Long term (current) use of aspirin: Secondary | ICD-10-CM | POA: Insufficient documentation

## 2016-06-10 DIAGNOSIS — R410 Disorientation, unspecified: Secondary | ICD-10-CM

## 2016-06-10 DIAGNOSIS — Z87891 Personal history of nicotine dependence: Secondary | ICD-10-CM | POA: Insufficient documentation

## 2016-06-10 DIAGNOSIS — F329 Major depressive disorder, single episode, unspecified: Secondary | ICD-10-CM | POA: Insufficient documentation

## 2016-06-10 LAB — URINALYSIS, COMPLETE (UACMP) WITH MICROSCOPIC
BACTERIA UA: NONE SEEN
BILIRUBIN URINE: NEGATIVE
GLUCOSE, UA: NEGATIVE mg/dL
Hgb urine dipstick: NEGATIVE
KETONES UR: NEGATIVE mg/dL
NITRITE: NEGATIVE
PH: 6 (ref 5.0–8.0)
PROTEIN: NEGATIVE mg/dL
Specific Gravity, Urine: 1.008 (ref 1.005–1.030)

## 2016-06-10 LAB — BASIC METABOLIC PANEL
ANION GAP: 5 (ref 5–15)
BUN: 20 mg/dL (ref 6–20)
CALCIUM: 9.1 mg/dL (ref 8.9–10.3)
CO2: 28 mmol/L (ref 22–32)
Chloride: 108 mmol/L (ref 101–111)
Creatinine, Ser: 1.01 mg/dL — ABNORMAL HIGH (ref 0.44–1.00)
GFR calc non Af Amer: 48 mL/min — ABNORMAL LOW (ref >60)
GFR, EST AFRICAN AMERICAN: 55 mL/min — AB (ref >60)
GLUCOSE: 142 mg/dL — AB (ref 65–99)
Potassium: 3.7 mmol/L (ref 3.5–5.1)
Sodium: 141 mmol/L (ref 135–145)

## 2016-06-10 LAB — CBC
HCT: 38.1 % (ref 35.0–47.0)
Hemoglobin: 12.9 g/dL (ref 12.0–16.0)
MCH: 30.9 pg (ref 26.0–34.0)
MCHC: 33.8 g/dL (ref 32.0–36.0)
MCV: 91.7 fL (ref 80.0–100.0)
PLATELETS: 81 10*3/uL — AB (ref 150–440)
RBC: 4.16 MIL/uL (ref 3.80–5.20)
RDW: 13.1 % (ref 11.5–14.5)
WBC: 4.3 10*3/uL (ref 3.6–11.0)

## 2016-06-10 LAB — TROPONIN I: Troponin I: <0.03 ng/mL (ref <0.03)

## 2016-06-10 NOTE — ED Notes (Signed)
Sylvester set up at bedside. Pending assessment.

## 2016-06-10 NOTE — ED Notes (Signed)
Pt sleeping soundly, even and unlabored respirations, will continue to monitor

## 2016-06-10 NOTE — Discharge Instructions (Signed)
Please follow-up with your doctor tomorrow morning as scheduled. As we discussed please return to the emergency department if you have any further confusion or at any time develop headache, slurred speech, weakness or numbness of any arm or leg.

## 2016-06-10 NOTE — ED Triage Notes (Signed)
Pt arrived to ED ED from home after pt was reported to have been telling husband she does not live in apartment that she does live in. Pt is alert to place and time. vitals are WNL per EMS. EMS reports pt was brought in after family reports increased AMS. Pt has hx of dementia.

## 2016-06-10 NOTE — ED Notes (Addendum)
EMS pt found in room att; no announcement/notification

## 2016-06-10 NOTE — ED Notes (Signed)
Pt reporting to MD after being asked "what brought you to the ED," that she does not want to live with "him" because she doesn't want to live at that apartment and that her and her ex husband have been divorced now for 40+ years. Pt is alert to self, place, situation and is able to tell the day of the week but unsure of the date and year.

## 2016-06-10 NOTE — ED Notes (Signed)
Pt delay to CT for toilet; but unable to urinate

## 2016-06-10 NOTE — BH Assessment (Addendum)
Assessment Note  Patient is 83 year old white female that denies SI/HI/Psychosis/Substance Abuse.   Per documentation in the epic chart the patient arrived to the ED from home after the patient told her husband that she does not live in the apartment with him.   Collateral information obtained from the patient's husband reports that the patient has lives in a facility for the past five years.  The patient does not have a history of mental illness.  Patient denies prior inpatient psychiatric hospitalization or outpatient mental health therapy.   Patient is alert and orientated to person time place and situation.     Diagnosis: Depressive Disorder   Past Medical History:  Past Medical History:  Diagnosis Date  . Arthritis   . Dyslipidemia   . HTN (hypertension)   . Hypothyroidism   . Severe aortic stenosis     Past Surgical History:  Procedure Laterality Date  . AORTIC VALVE REPLACEMENT  1999  . CYST EXCISION  20 years ago   both breast had cyst removed  . TOTAL ABDOMINAL HYSTERECTOMY     Uterine cancer    Family History:  Family History  Problem Relation Age of Onset  . Heart disease Father   . Heart disease Mother     Social History:  reports that she quit smoking about 20 years ago. Her smoking use included Cigarettes. She has never used smokeless tobacco. She reports that she drinks alcohol. Her drug history is not on file.  Additional Social History:  Alcohol / Drug Use History of alcohol / drug use?: Yes Longest period of sobriety (when/how long): None Reported  CIWA: CIWA-Ar BP: (!) 149/61 Pulse Rate: 77 COWS:    Allergies: No Known Allergies  Home Medications:  (Not in a hospital admission)  OB/GYN Status:  No LMP recorded. Patient has had a hysterectomy.  General Assessment Data Location of Assessment: [facility] ED TTS Assessment: In system Is this a Tele or Face-to-Face Assessment?: Face-to-Face Is this an Initial Assessment or a Re-assessment for this  encounter?: Initial Assessment Marital status: Married (Married for [employer] ) Elwin Sleight name: NA Is patient pregnant?: No Pregnancy Status: No Living Arrangements: Other (Comment) (Lives in a facility for the past five years) Can pt return to current living arrangement?: Yes Admission Status: Voluntary Is patient capable of signing voluntary admission?: Yes Referral Source: Self/Family/Friend Insurance type: [employer]  Medical Screening Exam ([place]) Medical Exam completed:  (NA)  Crisis Care Plan Living Arrangements: Other (Comment) (Lives in a facility for the past five years) Legal Guardian:  (NA) Name of Psychiatrist: None Reported Name of Therapist: None Reported  Education Status Is patient currently in school?: No Current Grade: NA Highest grade of school patient has completed: NA Name of school: NA Contact person: NA  Risk to self with the past 6 months Suicidal Ideation: No Has patient been a risk to self within the past 6 months prior to admission? : No Suicidal Intent: No Has patient had any suicidal intent within the past 6 months prior to admission? : No Is patient at risk for suicide?: No Suicidal Plan?: No Has patient had any suicidal plan within the past 6 months prior to admission? : No Access to Means: No What has been your use of drugs/alcohol within the last 12 months?: NA Previous Attempts/Gestures: No How many times?: 0 Other Self Harm Risks: NA Triggers for Past Attempts: None known Intentional Self Injurious Behavior: None Family Suicide History: No Recent stressful life event(s):  (  NA) Persecutory voices/beliefs?: No Depression: No Depression Symptoms:  (NA) Substance abuse history and/or treatment for substance abuse?: No Suicide prevention information given to non-admitted patients: Not applicable  Risk to Others within the past 6 months Homicidal Ideation: No Does patient have any lifetime risk of violence toward  others beyond the six months prior to admission? : No Thoughts of Harm to Others: No Current Homicidal Intent: No Current Homicidal Plan: No Access to Homicidal Means: No Identified Victim: NA History of harm to others?: No Assessment of Violence: None Noted Violent Behavior Description: NA Does patient have access to weapons?: No Criminal Charges Pending?: No Does patient have a court date: No Is patient on probation?: No  Psychosis Hallucinations: None noted Delusions: None noted  Mental Status Report Appearance/Hygiene: In scrubs Eye Contact: Fair Motor Activity: Freedom of movement Speech: Logical/coherent Level of Consciousness: Alert Mood: Anxious Affect: Appropriate to circumstance Anxiety Level: Minimal Thought Processes: Coherent, Relevant Judgement: Unimpaired Orientation: Person, Place, Time, Situation Obsessive Compulsive Thoughts/Behaviors: None  Cognitive Functioning Concentration: Decreased Memory: Recent Intact, Remote Intact IQ: Average Insight: Fair Impulse Control: Fair Appetite: Fair Weight Loss: 0 Weight Gain: 0 Sleep: No Change Total Hours of Sleep: 8 Vegetative Symptoms: None  ADLScreening [facility] Assessment Services) Patient's cognitive ability adequate to safely complete daily activities?: Yes Patient able to express need for assistance with ADLs?: Yes Independently performs ADLs?: No  Prior Inpatient Therapy Prior Inpatient Therapy: No Prior Therapy Dates: NA Prior Therapy Facilty/Provider(s): NA Reason for Treatment: NA  Prior Outpatient Therapy Prior Outpatient Therapy: No Prior Therapy Dates: NA Prior Therapy Facilty/Provider(s): NA Reason for Treatment: NA Does patient have an ACCT team?: No Does patient have Intensive In-House Services?  : No Does patient have [facility] services? : No Does patient have P4CC services?: No  ADL Screening (condition at time of admission) Patient's cognitive ability adequate to safely complete  daily activities?: Yes Is the patient deaf or have difficulty hearing?: No Does the patient have difficulty seeing, even when wearing glasses/contacts?: Yes Does the patient have difficulty concentrating, remembering, or making decisions?: Yes Patient able to express need for assistance with ADLs?: Yes Does the patient have difficulty dressing or bathing?: Yes Independently performs ADLs?: No Communication: Independent Dressing (OT): Needs assistance Grooming: Needs assistance Feeding: Needs assistance Bathing: Needs assistance Toileting: Needs assistance In/Out Bed: Needs assistance Walks in Home: Needs assistance Does the patient have difficulty walking or climbing stairs?: Yes Weakness of Legs: Both Weakness of Arms/Hands: Both  Home Assistive Devices/Equipment Home Assistive Devices/Equipment: Walker (specify type)    Abuse/Neglect Assessment (Assessment to be complete while patient is alone) Physical Abuse: Denies Verbal Abuse: Denies Sexual Abuse: Denies Exploitation of patient/patient's resources: Denies Self-Neglect: Denies Values / Beliefs Cultural Requests During Hospitalization: None Spiritual Requests During Hospitalization: None Consults Spiritual Care Consult Needed: No Social Work Consult Needed: No Regulatory affairs officer (For Healthcare) Does Patient Have a Medical Advance Directive?: Yes Does patient want to make changes to medical advance directive?: No - Patient declined Would patient like information on creating a medical advance directive?: No - Patient declined    Additional Information 1:1 In Past 12 Months?: No CIRT Risk: No Elopement Risk: No Does patient have medical clearance?: Yes     Disposition: Per SOC the patient does not meet criteria for inpatient.  Writer consulted with the ER MD of the disposition.   Disposition Initial Assessment Completed for this Encounter: Yes Disposition of Patient: Outpatient treatment Type of outpatient  treatment: Adult (Per [facility] patient  does not meet criteria. )  On Site Evaluation by:   Reviewed with Physician:    Graciella Freer [place] 06/10/2016 11:20 AM

## 2016-06-10 NOTE — ED Provider Notes (Signed)
-----------------------------------------   10:55 AM on 06/10/2016 -----------------------------------------  The specialist on-call psychiatrist has seen the patient they do not believe the patient requires any further psychiatric workup at this time. Patient is acting well this morning. The husband is here who states the patient is acting normal. He states this is somewhat of a chronic issue and the patient will occasionally get confused at nighttime, but last night was the worst. Patient has a primary care appointment in 9 AM tomorrow. I discussed the plan of care with the patient and her husband discharged home and to follow-up with primary care doctor tomorrow morning but if any further confusion occurs they are to return to the emergency department. Patient's medical workup has been largely within normal limits.   Harvest Dark, MD 06/10/16 1056

## 2016-06-10 NOTE — ED Notes (Signed)
ED Provider at bedside. 

## 2016-06-10 NOTE — ED Notes (Signed)
Patient transported to X-ray 

## 2016-06-10 NOTE — ED Provider Notes (Signed)
[facility] Emergency Department Provider Note   ____________________________________________   First MD Initiated Contact with Patient 06/10/16 [phone]     (approximate)  I have reviewed the triage vital signs and the nursing notes.   HISTORY  Chief Complaint Altered Mental Status    HPI [name] is a 83 y.o. female who comes into the hospital today with some alteration of her mental status. The patient lives at home with her husband but this evening told her husband that she no longer lives there. EMS reports that she is responding appropriately but she stated that she wanted to go home. The patient states that she hasn't been married to her husband for over 40 years. She reports that they have been divorced and he wanted her to stay in the department. The patient states that she just wouldn't do it. The husband was concerned so he contacted EMS and police who came to evaluate the patient. They wanted to have the patient seen and evaluated for possible infection causing this acute change in her mental status. The patient reports that she feels completely fine and she doesn't know why she is here. She is adamant though that she does not live at that facility. She reports that she lives in a nursing home but she does not room for the main. The patient knows that it is Saturday in March but she thinks is 2017. She did not know the date. She knows that she is in [place] and a hospital but does not remember the name of the hospital. She reports that she falls a lot but she is also fine. The patient has no other complaints.   Past Medical History:  Diagnosis Date  . Arthritis   . Dyslipidemia   . HTN (hypertension)   . Hypothyroidism   . Severe aortic stenosis     Patient Active Problem List   Diagnosis Date Noted  . Severe aortic stenosis   . HTN (hypertension)   . Dyslipidemia   . Hypothyroidism   . CONSTIPATION 06/20/2009    Past Surgical  History:  Procedure Laterality Date  . AORTIC VALVE REPLACEMENT  1999  . CYST EXCISION  20 years ago   both breast had cyst removed  . TOTAL ABDOMINAL HYSTERECTOMY     Uterine cancer    Prior to Admission medications   Medication Sig Start Date End Date Taking? Authorizing Provider  aspirin EC 81 MG tablet Take 81 mg by mouth daily.    Historical Provider, MD  Calcium Carbonate-Vitamin D (CALCIUM 600+D) 600-400 MG-UNIT tablet Take 1 tablet by mouth 2 (two) times daily.    Historical Provider, MD  cephALEXin (KEFLEX) 500 MG capsule Take 1 capsule (500 mg total) by mouth 3 (three) times daily. Patient not taking: Reported on 05/24/2015 03/22/15   Carrie Mew, MD  ipratropium (ATROVENT) 0.03 % nasal spray Place 1-2 sprays into both nostrils 2 (two) times daily as needed for rhinitis.     Historical Provider, MD  levothyroxine (SYNTHROID, LEVOTHROID) 100 MCG tablet Take 100 mcg by mouth daily before breakfast.    Historical Provider, MD  meloxicam (MOBIC) 7.5 MG tablet Take 7.5 mg by mouth 2 (two) times daily after a meal.    Historical Provider, MD  metoprolol (LOPRESSOR) 50 MG tablet Take 50 mg by mouth 2 (two) times daily.     Peter M Martinique, MD  Multiple Vitamin (MULTIVITAMIN WITH MINERALS) TABS tablet Take 1 tablet by mouth daily.  Historical Provider, MD  Multiple Vitamins-Minerals (ICAPS AREDS 2) CAPS Take 1 capsule by mouth daily.    Historical Provider, MD  rosuvastatin (CRESTOR) 20 MG tablet Take 20 mg by mouth daily.    Historical Provider, MD    Allergies Patient has no known allergies.  Family History  Problem Relation Age of Onset  . Heart disease Father   . Heart disease Mother     Social History Social History  Substance Use Topics  . Smoking status: Former Smoker    Types: Cigarettes    Quit date: 06/20/1995  . Smokeless tobacco: Never Used  . Alcohol use Yes     Comment: Occ    Review of Systems Constitutional: No fever/chills Eyes: No visual  changes. ENT: No sore throat. Cardiovascular: Denies chest pain. Respiratory: Denies shortness of breath. Gastrointestinal: No abdominal pain.  No nausea, no vomiting.  No diarrhea.  No constipation. Genitourinary: Negative for dysuria. Musculoskeletal: Negative for back pain. Skin: Negative for rash. Neurological: Altered mental status  10-point ROS otherwise negative.  ____________________________________________   PHYSICAL EXAM:  VITAL SIGNS: ED Triage Vitals [06/10/16 0040]  Enc Vitals Group     BP (!) 173/73     Pulse Rate 84     Resp 16     Temp      Temp src      SpO2 99 %     Weight      Height      Head Circumference      Peak Flow      Pain Score      Pain Loc      Pain Edu?      Excl. in [place]?     Constitutional: Alert and oriented 2. Well appearing and in no acute distress. Eyes: Conjunctivae are normal. PERRL. EOMI. Head: Atraumatic. Nose: No congestion/rhinnorhea. Mouth/Throat: Mucous membranes are moist.  Oropharynx non-erythematous. Cardiovascular: Normal rate, regular rhythm. Grossly normal heart sounds.  Good peripheral circulation. Respiratory: Normal respiratory effort.  No retractions. Lungs CTAB. Gastrointestinal: Soft and nontender. No distention. Positive bowel sounds Musculoskeletal: No lower extremity tenderness nor edema.   Neurologic:  Normal speech and language. Cranial nerves II through XII grossly intact with no focal motor or neuro deficits Skin:  Skin is warm, dry and intact. Psychiatric: Mood and affect are normal.   ____________________________________________   LABS (all labs ordered are listed, but only abnormal results are displayed)  Labs Reviewed  CBC - Abnormal; Notable for the following:       Result Value   Platelets 81 (*)    All other components within normal limits  BASIC METABOLIC PANEL - Abnormal; Notable for the following:    Glucose, Bld 142 (*)    Creatinine, Ser 1.01 (*)    GFR calc non Af Amer 48 (*)     GFR calc Af Amer 55 (*)    All other components within normal limits  URINALYSIS, COMPLETE (UACMP) WITH MICROSCOPIC - Abnormal; Notable for the following:    Color, Urine STRAW (*)    APPearance CLEAR (*)    Leukocytes, UA TRACE (*)    Squamous Epithelial / LPF 0-5 (*)    All other components within normal limits  TROPONIN I   ____________________________________________  EKG  ED ECG REPORT I, Loney Hering, the attending physician, personally viewed and interpreted this ECG.   Date: 06/10/2016  EKG Time: 0046  Rate: 72  Rhythm: normal sinus rhythm  Axis: normal  Intervals:none  ST&T Change: none  ____________________________________________  RADIOLOGY  CT head ____________________________________________   PROCEDURES  Procedure(s) performed: None  Procedures  Critical Care performed: No  ____________________________________________   INITIAL IMPRESSION / ASSESSMENT AND PLAN / ED COURSE  Pertinent labs & imaging results that were available during my care of the patient were reviewed by me and considered in my medical decision making (see chart for details).  This is a 83 year old female who comes into the hospital today with some altered mental status. We will check some blood work and urine as well as a CT scan. The patient denies any pain at this time. I will reassess the patient once I received all of her results.  Clinical Course as of Jun 10 724  Sun Jun 10, 2016  0459 1. No acute intracranial process. 2. Stable atrophy with moderate chronic microvascular ischemic disease.   CT Head Wo Contrast [AW]    Clinical Course User Index [AW] Loney Hering, MD    The patient will be seen by Telepsych for further evaluation.  ____________________________________________   FINAL CLINICAL IMPRESSION(S) / ED DIAGNOSES  Final diagnoses:  Confusion  Altered mental status, unspecified altered mental status type      NEW MEDICATIONS STARTED  DURING THIS VISIT:  New Prescriptions   No medications on file     Note:  This document was prepared using Dragon voice recognition software and may include unintentional dictation errors.    Loney Hering, MD 06/10/16 [phone]

## 2016-06-11 DIAGNOSIS — E538 Deficiency of other specified B group vitamins: Secondary | ICD-10-CM | POA: Diagnosis not present

## 2016-06-14 DIAGNOSIS — R269 Unspecified abnormalities of gait and mobility: Secondary | ICD-10-CM | POA: Diagnosis not present

## 2016-06-14 DIAGNOSIS — E538 Deficiency of other specified B group vitamins: Secondary | ICD-10-CM | POA: Diagnosis not present

## 2016-06-14 DIAGNOSIS — R404 Transient alteration of awareness: Secondary | ICD-10-CM | POA: Diagnosis not present

## 2016-07-04 DIAGNOSIS — E039 Hypothyroidism, unspecified: Secondary | ICD-10-CM | POA: Diagnosis not present

## 2016-07-11 DIAGNOSIS — I10 Essential (primary) hypertension: Secondary | ICD-10-CM | POA: Diagnosis not present

## 2016-07-11 DIAGNOSIS — R296 Repeated falls: Secondary | ICD-10-CM | POA: Diagnosis not present

## 2016-07-11 DIAGNOSIS — E039 Hypothyroidism, unspecified: Secondary | ICD-10-CM | POA: Diagnosis not present

## 2016-07-11 DIAGNOSIS — E7800 Pure hypercholesterolemia, unspecified: Secondary | ICD-10-CM | POA: Diagnosis not present

## 2016-07-17 ENCOUNTER — Ambulatory Visit: Payer: PPO

## 2016-07-19 ENCOUNTER — Ambulatory Visit: Payer: PPO

## 2016-07-24 ENCOUNTER — Ambulatory Visit: Payer: PPO

## 2016-07-26 ENCOUNTER — Ambulatory Visit: Payer: PPO

## 2016-07-31 ENCOUNTER — Ambulatory Visit: Payer: PPO

## 2016-08-02 ENCOUNTER — Ambulatory Visit: Payer: PPO

## 2016-08-06 ENCOUNTER — Ambulatory Visit: Payer: PPO

## 2016-08-08 ENCOUNTER — Ambulatory Visit: Payer: PPO

## 2016-08-13 ENCOUNTER — Ambulatory Visit: Payer: PPO

## 2016-08-15 ENCOUNTER — Ambulatory Visit: Payer: PPO

## 2016-08-20 ENCOUNTER — Ambulatory Visit: Payer: PPO

## 2016-08-22 ENCOUNTER — Ambulatory Visit: Payer: PPO

## 2016-08-22 DIAGNOSIS — E559 Vitamin D deficiency, unspecified: Secondary | ICD-10-CM | POA: Diagnosis not present

## 2016-08-22 DIAGNOSIS — E538 Deficiency of other specified B group vitamins: Secondary | ICD-10-CM | POA: Diagnosis not present

## 2016-08-29 ENCOUNTER — Ambulatory Visit: Payer: PPO

## 2016-08-31 DIAGNOSIS — H353131 Nonexudative age-related macular degeneration, bilateral, early dry stage: Secondary | ICD-10-CM | POA: Diagnosis not present

## 2016-08-31 DIAGNOSIS — H16223 Keratoconjunctivitis sicca, not specified as Sjogren's, bilateral: Secondary | ICD-10-CM | POA: Diagnosis not present

## 2016-09-07 DIAGNOSIS — H16223 Keratoconjunctivitis sicca, not specified as Sjogren's, bilateral: Secondary | ICD-10-CM | POA: Diagnosis not present

## 2016-09-21 DIAGNOSIS — R2689 Other abnormalities of gait and mobility: Secondary | ICD-10-CM | POA: Diagnosis not present

## 2016-09-21 DIAGNOSIS — R404 Transient alteration of awareness: Secondary | ICD-10-CM | POA: Diagnosis not present

## 2016-09-21 DIAGNOSIS — R269 Unspecified abnormalities of gait and mobility: Secondary | ICD-10-CM | POA: Diagnosis not present

## 2016-09-21 DIAGNOSIS — E538 Deficiency of other specified B group vitamins: Secondary | ICD-10-CM | POA: Diagnosis not present

## 2016-09-24 DIAGNOSIS — H1013 Acute atopic conjunctivitis, bilateral: Secondary | ICD-10-CM | POA: Diagnosis not present

## 2016-10-02 DIAGNOSIS — E039 Hypothyroidism, unspecified: Secondary | ICD-10-CM | POA: Diagnosis not present

## 2016-10-02 DIAGNOSIS — I10 Essential (primary) hypertension: Secondary | ICD-10-CM | POA: Diagnosis not present

## 2016-10-02 DIAGNOSIS — E538 Deficiency of other specified B group vitamins: Secondary | ICD-10-CM | POA: Diagnosis not present

## 2016-10-02 DIAGNOSIS — E7800 Pure hypercholesterolemia, unspecified: Secondary | ICD-10-CM | POA: Diagnosis not present

## 2016-10-10 DIAGNOSIS — R296 Repeated falls: Secondary | ICD-10-CM | POA: Diagnosis not present

## 2016-10-10 DIAGNOSIS — E039 Hypothyroidism, unspecified: Secondary | ICD-10-CM | POA: Diagnosis not present

## 2016-10-10 DIAGNOSIS — E7800 Pure hypercholesterolemia, unspecified: Secondary | ICD-10-CM | POA: Diagnosis not present

## 2016-10-10 DIAGNOSIS — I10 Essential (primary) hypertension: Secondary | ICD-10-CM | POA: Diagnosis not present

## 2016-10-16 ENCOUNTER — Other Ambulatory Visit: Payer: Self-pay | Admitting: Family Medicine

## 2016-10-16 ENCOUNTER — Ambulatory Visit
Admission: RE | Admit: 2016-10-16 | Discharge: 2016-10-16 | Disposition: A | Discharge status: Home / Self Care | Payer: PPO | Source: Ambulatory Visit | Attending: Family Medicine | Admitting: Family Medicine

## 2016-10-16 DIAGNOSIS — R4182 Altered mental status, unspecified: Secondary | ICD-10-CM | POA: Diagnosis not present

## 2016-10-16 DIAGNOSIS — I6782 Cerebral ischemia: Secondary | ICD-10-CM | POA: Insufficient documentation

## 2016-10-16 DIAGNOSIS — Y92480 Sidewalk as the place of occurrence of the external cause: Secondary | ICD-10-CM | POA: Diagnosis not present

## 2016-10-16 DIAGNOSIS — S0990XA Unspecified injury of head, initial encounter: Secondary | ICD-10-CM | POA: Diagnosis not present

## 2016-10-16 DIAGNOSIS — W108XXA Fall (on) (from) other stairs and steps, initial encounter: Secondary | ICD-10-CM

## 2016-10-16 DIAGNOSIS — I499 Cardiac arrhythmia, unspecified: Secondary | ICD-10-CM | POA: Diagnosis not present

## 2016-10-16 DIAGNOSIS — W01198A Fall on same level from slipping, tripping and stumbling with subsequent striking against other object, initial encounter: Secondary | ICD-10-CM | POA: Diagnosis not present

## 2016-10-16 DIAGNOSIS — G319 Degenerative disease of nervous system, unspecified: Secondary | ICD-10-CM | POA: Diagnosis not present

## 2016-10-19 DIAGNOSIS — S0990XA Unspecified injury of head, initial encounter: Secondary | ICD-10-CM | POA: Diagnosis not present

## 2016-10-19 DIAGNOSIS — M542 Cervicalgia: Secondary | ICD-10-CM | POA: Diagnosis not present

## 2016-10-19 DIAGNOSIS — M25551 Pain in right hip: Secondary | ICD-10-CM | POA: Diagnosis not present

## 2016-10-19 DIAGNOSIS — R4182 Altered mental status, unspecified: Secondary | ICD-10-CM | POA: Diagnosis not present

## 2016-10-19 DIAGNOSIS — M1611 Unilateral primary osteoarthritis, right hip: Secondary | ICD-10-CM | POA: Diagnosis not present

## 2016-10-19 DIAGNOSIS — I10 Essential (primary) hypertension: Secondary | ICD-10-CM | POA: Diagnosis not present

## 2016-10-26 DIAGNOSIS — R296 Repeated falls: Secondary | ICD-10-CM | POA: Diagnosis not present

## 2016-10-26 DIAGNOSIS — E039 Hypothyroidism, unspecified: Secondary | ICD-10-CM | POA: Diagnosis not present

## 2016-11-19 DIAGNOSIS — R296 Repeated falls: Secondary | ICD-10-CM | POA: Diagnosis not present

## 2016-11-19 DIAGNOSIS — Z952 Presence of prosthetic heart valve: Secondary | ICD-10-CM | POA: Diagnosis not present

## 2016-11-19 DIAGNOSIS — I10 Essential (primary) hypertension: Secondary | ICD-10-CM | POA: Diagnosis not present

## 2016-11-19 DIAGNOSIS — R011 Cardiac murmur, unspecified: Secondary | ICD-10-CM | POA: Diagnosis not present

## 2016-11-19 DIAGNOSIS — M1990 Unspecified osteoarthritis, unspecified site: Secondary | ICD-10-CM | POA: Diagnosis not present

## 2016-11-19 DIAGNOSIS — I351 Nonrheumatic aortic (valve) insufficiency: Secondary | ICD-10-CM | POA: Diagnosis not present

## 2016-11-19 DIAGNOSIS — R42 Dizziness and giddiness: Secondary | ICD-10-CM | POA: Diagnosis not present

## 2016-11-19 DIAGNOSIS — R634 Abnormal weight loss: Secondary | ICD-10-CM | POA: Diagnosis not present

## 2016-11-19 DIAGNOSIS — E079 Disorder of thyroid, unspecified: Secondary | ICD-10-CM | POA: Diagnosis not present

## 2016-11-19 DIAGNOSIS — E784 Other hyperlipidemia: Secondary | ICD-10-CM | POA: Diagnosis not present

## 2016-11-19 DIAGNOSIS — R270 Ataxia, unspecified: Secondary | ICD-10-CM | POA: Diagnosis not present

## 2016-11-19 DIAGNOSIS — I350 Nonrheumatic aortic (valve) stenosis: Secondary | ICD-10-CM | POA: Diagnosis not present

## 2017-03-15 DIAGNOSIS — G9349 Other encephalopathy: Secondary | ICD-10-CM | POA: Diagnosis not present

## 2017-03-15 DIAGNOSIS — M25551 Pain in right hip: Secondary | ICD-10-CM | POA: Diagnosis not present

## 2017-03-20 DIAGNOSIS — R296 Repeated falls: Secondary | ICD-10-CM | POA: Diagnosis not present

## 2017-03-20 DIAGNOSIS — M6281 Muscle weakness (generalized): Secondary | ICD-10-CM | POA: Diagnosis not present

## 2017-04-03 DIAGNOSIS — Z950 Presence of cardiac pacemaker: Secondary | ICD-10-CM | POA: Diagnosis not present

## 2017-04-03 DIAGNOSIS — Z87891 Personal history of nicotine dependence: Secondary | ICD-10-CM | POA: Diagnosis not present

## 2017-04-03 DIAGNOSIS — G9349 Other encephalopathy: Secondary | ICD-10-CM | POA: Diagnosis not present

## 2017-04-03 DIAGNOSIS — M6281 Muscle weakness (generalized): Secondary | ICD-10-CM | POA: Diagnosis not present

## 2017-04-03 DIAGNOSIS — Z952 Presence of prosthetic heart valve: Secondary | ICD-10-CM | POA: Diagnosis not present

## 2017-04-03 DIAGNOSIS — I10 Essential (primary) hypertension: Secondary | ICD-10-CM | POA: Diagnosis not present

## 2017-04-03 DIAGNOSIS — R296 Repeated falls: Secondary | ICD-10-CM | POA: Diagnosis not present

## 2017-04-03 DIAGNOSIS — R410 Disorientation, unspecified: Secondary | ICD-10-CM | POA: Diagnosis not present

## 2017-04-03 DIAGNOSIS — M1990 Unspecified osteoarthritis, unspecified site: Secondary | ICD-10-CM | POA: Diagnosis not present

## 2017-04-12 DIAGNOSIS — M6281 Muscle weakness (generalized): Secondary | ICD-10-CM | POA: Diagnosis not present

## 2017-04-12 DIAGNOSIS — R410 Disorientation, unspecified: Secondary | ICD-10-CM | POA: Diagnosis not present

## 2017-04-12 DIAGNOSIS — M1990 Unspecified osteoarthritis, unspecified site: Secondary | ICD-10-CM | POA: Diagnosis not present

## 2017-04-12 DIAGNOSIS — R296 Repeated falls: Secondary | ICD-10-CM | POA: Diagnosis not present

## 2017-05-07 DIAGNOSIS — H53001 Unspecified amblyopia, right eye: Secondary | ICD-10-CM | POA: Diagnosis not present

## 2017-05-16 DIAGNOSIS — I10 Essential (primary) hypertension: Secondary | ICD-10-CM | POA: Diagnosis not present

## 2017-05-16 DIAGNOSIS — Z87891 Personal history of nicotine dependence: Secondary | ICD-10-CM | POA: Diagnosis not present

## 2017-05-16 DIAGNOSIS — M1990 Unspecified osteoarthritis, unspecified site: Secondary | ICD-10-CM | POA: Diagnosis not present

## 2017-05-16 DIAGNOSIS — M6281 Muscle weakness (generalized): Secondary | ICD-10-CM | POA: Diagnosis not present

## 2017-05-16 DIAGNOSIS — R410 Disorientation, unspecified: Secondary | ICD-10-CM | POA: Diagnosis not present

## 2017-05-16 DIAGNOSIS — R296 Repeated falls: Secondary | ICD-10-CM | POA: Diagnosis not present

## 2017-05-16 DIAGNOSIS — Z950 Presence of cardiac pacemaker: Secondary | ICD-10-CM | POA: Diagnosis not present

## 2017-05-16 DIAGNOSIS — G9349 Other encephalopathy: Secondary | ICD-10-CM | POA: Diagnosis not present

## 2017-05-16 DIAGNOSIS — Z952 Presence of prosthetic heart valve: Secondary | ICD-10-CM | POA: Diagnosis not present

## 2017-05-22 DIAGNOSIS — R270 Ataxia, unspecified: Secondary | ICD-10-CM | POA: Diagnosis not present

## 2017-05-22 DIAGNOSIS — R296 Repeated falls: Secondary | ICD-10-CM | POA: Diagnosis not present

## 2017-05-22 DIAGNOSIS — S0990XD Unspecified injury of head, subsequent encounter: Secondary | ICD-10-CM | POA: Diagnosis not present

## 2017-05-22 DIAGNOSIS — I10 Essential (primary) hypertension: Secondary | ICD-10-CM | POA: Diagnosis not present

## 2017-05-22 DIAGNOSIS — E7849 Other hyperlipidemia: Secondary | ICD-10-CM | POA: Diagnosis not present

## 2017-05-22 DIAGNOSIS — R011 Cardiac murmur, unspecified: Secondary | ICD-10-CM | POA: Diagnosis not present

## 2017-05-22 DIAGNOSIS — R42 Dizziness and giddiness: Secondary | ICD-10-CM | POA: Diagnosis not present

## 2017-05-22 DIAGNOSIS — R410 Disorientation, unspecified: Secondary | ICD-10-CM | POA: Diagnosis not present

## 2017-05-22 DIAGNOSIS — I350 Nonrheumatic aortic (valve) stenosis: Secondary | ICD-10-CM | POA: Diagnosis not present

## 2017-05-22 DIAGNOSIS — Z952 Presence of prosthetic heart valve: Secondary | ICD-10-CM | POA: Diagnosis not present

## 2017-06-14 ENCOUNTER — Emergency Department: Payer: PPO

## 2017-06-14 ENCOUNTER — Other Ambulatory Visit: Payer: Self-pay

## 2017-06-14 ENCOUNTER — Emergency Department
Admission: EM | Admit: 2017-06-14 | Discharge: 2017-06-15 | Disposition: A | Discharge status: Home / Self Care | Payer: PPO | Attending: Emergency Medicine | Admitting: Emergency Medicine

## 2017-06-14 DIAGNOSIS — I10 Essential (primary) hypertension: Secondary | ICD-10-CM | POA: Diagnosis not present

## 2017-06-14 DIAGNOSIS — F0390 Unspecified dementia without behavioral disturbance: Secondary | ICD-10-CM | POA: Diagnosis not present

## 2017-06-14 DIAGNOSIS — Z7982 Long term (current) use of aspirin: Secondary | ICD-10-CM | POA: Insufficient documentation

## 2017-06-14 DIAGNOSIS — Z87891 Personal history of nicotine dependence: Secondary | ICD-10-CM | POA: Diagnosis not present

## 2017-06-14 DIAGNOSIS — Y929 Unspecified place or not applicable: Secondary | ICD-10-CM | POA: Diagnosis not present

## 2017-06-14 DIAGNOSIS — E039 Hypothyroidism, unspecified: Secondary | ICD-10-CM | POA: Insufficient documentation

## 2017-06-14 DIAGNOSIS — Y9301 Activity, walking, marching and hiking: Secondary | ICD-10-CM | POA: Insufficient documentation

## 2017-06-14 DIAGNOSIS — S42201A Unspecified fracture of upper end of right humerus, initial encounter for closed fracture: Secondary | ICD-10-CM | POA: Insufficient documentation

## 2017-06-14 DIAGNOSIS — M25511 Pain in right shoulder: Secondary | ICD-10-CM | POA: Diagnosis not present

## 2017-06-14 DIAGNOSIS — W1830XA Fall on same level, unspecified, initial encounter: Secondary | ICD-10-CM | POA: Insufficient documentation

## 2017-06-14 DIAGNOSIS — Y999 Unspecified external cause status: Secondary | ICD-10-CM | POA: Insufficient documentation

## 2017-06-14 DIAGNOSIS — S0990XA Unspecified injury of head, initial encounter: Secondary | ICD-10-CM | POA: Diagnosis not present

## 2017-06-14 DIAGNOSIS — W19XXXA Unspecified fall, initial encounter: Secondary | ICD-10-CM | POA: Diagnosis not present

## 2017-06-14 DIAGNOSIS — S4991XA Unspecified injury of right shoulder and upper arm, initial encounter: Secondary | ICD-10-CM | POA: Diagnosis present

## 2017-06-14 DIAGNOSIS — M25552 Pain in left hip: Secondary | ICD-10-CM | POA: Diagnosis not present

## 2017-06-14 DIAGNOSIS — Z79899 Other long term (current) drug therapy: Secondary | ICD-10-CM | POA: Insufficient documentation

## 2017-06-14 DIAGNOSIS — S199XXA Unspecified injury of neck, initial encounter: Secondary | ICD-10-CM | POA: Diagnosis not present

## 2017-06-14 DIAGNOSIS — S79912A Unspecified injury of left hip, initial encounter: Secondary | ICD-10-CM | POA: Diagnosis not present

## 2017-06-14 LAB — BASIC METABOLIC PANEL
Anion gap: 10 (ref 5–15)
BUN: 18 mg/dL (ref 6–20)
CO2: 24 mmol/L (ref 22–32)
CREATININE: 1.22 mg/dL — AB (ref 0.44–1.00)
Calcium: 8.8 mg/dL — ABNORMAL LOW (ref 8.9–10.3)
Chloride: 106 mmol/L (ref 101–111)
GFR calc Af Amer: 44 mL/min — ABNORMAL LOW (ref >60)
GFR, EST NON AFRICAN AMERICAN: 38 mL/min — AB (ref >60)
GLUCOSE: 147 mg/dL — AB (ref 65–99)
POTASSIUM: 3.7 mmol/L (ref 3.5–5.1)
Sodium: 140 mmol/L (ref 135–145)

## 2017-06-14 LAB — CBC WITH DIFFERENTIAL/PLATELET
Basophils Absolute: 0 10*3/uL (ref 0–0.1)
Basophils Relative: 1 %
EOS PCT: 3 %
Eosinophils Absolute: 0.1 10*3/uL (ref 0–0.7)
HCT: 37.5 % (ref 35.0–47.0)
Hemoglobin: 12.5 g/dL (ref 12.0–16.0)
LYMPHS ABS: 1 10*3/uL (ref 1.0–3.6)
LYMPHS PCT: 22 %
MCH: 32.1 pg (ref 26.0–34.0)
MCHC: 33.2 g/dL (ref 32.0–36.0)
MCV: 96.8 fL (ref 80.0–100.0)
MONO ABS: 0.3 10*3/uL (ref 0.2–0.9)
MONOS PCT: 7 %
Neutro Abs: 3.1 10*3/uL (ref 1.4–6.5)
Neutrophils Relative %: 67 %
Platelets: 77 10*3/uL — ABNORMAL LOW (ref 150–440)
RBC: 3.88 MIL/uL (ref 3.80–5.20)
RDW: 13.5 % (ref 11.5–14.5)
WBC: 4.5 10*3/uL (ref 3.6–11.0)

## 2017-06-14 LAB — PROTIME-INR
INR: 0.97
Prothrombin Time: 12.8 seconds (ref 11.4–15.2)

## 2017-06-14 LAB — TROPONIN I: Troponin I: <0.03 ng/mL (ref <0.03)

## 2017-06-14 LAB — CK: Total CK: 57 U/L (ref 38–234)

## 2017-06-14 MED ORDER — OXYCODONE-ACETAMINOPHEN 5-325 MG PO TABS: 1.0000 | ORAL_TABLET | ORAL | 0 refills | Status: DC | PRN

## 2017-06-14 MED ORDER — OXYCODONE-ACETAMINOPHEN 5-325 MG PO TABS
1.0000 | ORAL_TABLET | Freq: Once | ORAL | Status: AC
  Administered 2017-06-14: 1 via ORAL
  Filled 2017-06-14: qty 1

## 2017-06-14 MED ORDER — DOCUSATE SODIUM 100 MG PO CAPS: 100.0000 mg | ORAL_CAPSULE | Freq: Every day | ORAL | 2 refills | Status: DC | PRN

## 2017-06-14 MED ORDER — FENTANYL CITRATE (PF) 100 MCG/2ML IJ SOLN
50.0000 ug | Freq: Once | INTRAMUSCULAR | Status: AC
  Administered 2017-06-14: 50 ug via INTRAVENOUS
  Filled 2017-06-14: qty 2

## 2017-06-14 NOTE — ED Provider Notes (Addendum)
[facility] Emergency Department Provider Note  ____________________________________________   I have reviewed the triage vital signs and the nursing notes. Where available I have reviewed prior notes and, if possible and indicated, outside hospital notes.    HISTORY  Chief Complaint Fall and Shoulder Pain    HPI [name] is a 84 y.o. female   who was walking with her husband and fell, it was a witnessed fall, non-syncopal, landed on her right shoulder.  Immediate cry it out.  Did not pass out.  Did bump her head.  Not on blood thinners.  Patient is at her baseline mental status at this time.  She does have a history of recurrent frequent falls.  Aside from pain in her right shoulder she has no complaint.  She denies numbness or weakness.  Nothing makes the pain better except for immobilization, nothing makes it worse except for moving it. History is per EMS, family, and patient    Past Medical History:  Diagnosis Date  . Arthritis   . Dyslipidemia   . HTN (hypertension)   . Hypothyroidism   . Severe aortic stenosis     Patient Active Problem List   Diagnosis Date Noted  . Severe aortic stenosis   . HTN (hypertension)   . Dyslipidemia   . Hypothyroidism   . CONSTIPATION 06/20/2009    Past Surgical History:  Procedure Laterality Date  . AORTIC VALVE REPLACEMENT  1999  . CYST EXCISION  20 years ago   both breast had cyst removed  . TOTAL ABDOMINAL HYSTERECTOMY     Uterine cancer    Prior to Admission medications   Medication Sig Start Date End Date Taking? Authorizing Provider  aspirin EC 81 MG tablet Take 81 mg by mouth daily.   Yes [provider]  Calcium Carbonate-Vitamin D (CALCIUM 600+D) 600-400 MG-UNIT tablet Take 1 tablet by mouth 2 (two) times daily.   Yes [provider]  ipratropium (ATROVENT) 0.03 % nasal spray Place 1-2 sprays into both nostrils 2 (two) times daily as needed for rhinitis.    Yes [provider]  levothyroxine (SYNTHROID, LEVOTHROID) 50 MCG tablet Take 50 mcg by mouth daily before breakfast.    Yes [provider]  meloxicam (MOBIC) 7.5 MG tablet Take 7.5 mg by mouth 2 (two) times daily after a meal.   Yes [provider]  rosuvastatin (CRESTOR) 20 MG tablet Take 20 mg by mouth daily.   Yes [provider]  cephALEXin (KEFLEX) 500 MG capsule Take 1 capsule (500 mg total) by mouth 3 (three) times daily. Patient not taking: Reported on 05/24/2015 03/22/15   Carrie Mew, MD  docusate sodium (COLACE) 100 MG capsule Take 1 capsule (100 mg total) by mouth daily as needed. 06/14/17 06/14/18  Schuyler Amor, MD  oxyCODONE-acetaminophen (PERCOCET) 5-325 MG tablet Take 1 tablet by mouth every 4 (four) hours as needed for severe pain. 06/14/17   Schuyler Amor, MD    Allergies Patient has no known allergies.  Family History  Problem Relation Age of Onset  . Heart disease Father   . Heart disease Mother     Social History Social History   Tobacco Use  . Smoking status: Former Smoker    Types: Cigarettes    Last attempt to quit: 06/20/1995    Years since quitting: 22.0  . Smokeless tobacco: Never Used  Substance Use Topics  . Alcohol use: Yes    Comment: Occ  . Drug  use: No    Review of Systems Constitutional: No fever/chills Eyes: No visual changes. ENT: No sore throat. No stiff neck no neck pain Cardiovascular: Denies chest pain. Respiratory: Denies shortness of breath. Gastrointestinal:   no vomiting.  No diarrhea.  No constipation. Genitourinary: Negative for dysuria. Musculoskeletal: Negative lower extremity swelling Skin: Negative for rash. Neurological: Negative for severe headaches, focal weakness or numbness.   ____________________________________________   PHYSICAL EXAM:  VITAL SIGNS: ED Triage Vitals  Enc Vitals Group     BP 06/14/17 1400 (!) 143/64     Pulse Rate 06/14/17 1400 66     Resp 06/14/17 1400 (!)  24     Temp --      Temp src --      SpO2 06/14/17 1400 96 %     Weight 06/14/17 1358 130 lb (59 kg)     Height 06/14/17 1358 5' (1.524 m)     Head Circumference --      Peak Flow --      Pain Score 06/14/17 1357 10     Pain Loc --      Pain Edu? --      Excl. in [place]? --     Constitutional: Alert and oriented to demented baseline knows the hospital and knows her name. Well appearing and in no acute distress. Eyes: Conjunctivae are normal Head: Abrasion above the right eye HEENT: No congestion/rhinnorhea. Mucous membranes are moist.  Oropharynx non-erythematous Neck:   Nontender with no meningismus, no masses, no stridor Cardiovascular: Normal rate, regular rhythm. Grossly normal heart sounds.  Good peripheral circulation. Respiratory: Normal respiratory effort.  No retractions. Lungs CTAB. Abdominal: Soft and nontender. No distention. No guarding no rebound Back:  There is no focal tenderness or step off.  there is no midline tenderness there are no lesions noted. there is no CVA tenderness Musculoskeletal: There is tender to palpation in the right proximal humerus with obvious deformity which is mild, strong distal pulses compartments are soft, neurovascular intact.  There is slight tenderness to palpation in the left hip but no evidence of deformity and she can range it . No joint effusions, no DVT signs strong distal pulses no edema Neurologic:  Normal speech and language. No gross focal neurologic deficits are appreciated.  Skin:  Skin is warm, dry and intact. No rash noted. Psychiatric: Mood and affect are normal. Speech and behavior are normal.  ____________________________________________   LABS (all labs ordered are listed, but only abnormal results are displayed)  Labs Reviewed  CBC WITH DIFFERENTIAL/PLATELET - Abnormal; Notable for the following components:      Result Value   Platelets 77 (*)    All other components within normal limits  BASIC METABOLIC PANEL -  Abnormal; Notable for the following components:   Glucose, Bld 147 (*)    Creatinine, Ser 1.22 (*)    Calcium 8.8 (*)    GFR calc non Af Amer 38 (*)    GFR calc Af Amer 44 (*)    All other components within normal limits  TROPONIN I  CK  PROTIME-INR  URINALYSIS, COMPLETE (UACMP) WITH MICROSCOPIC    Pertinent labs  results that were available during my care of the patient were reviewed by me and considered in my medical decision making (see chart for details). ____________________________________________  EKG  I personally interpreted any EKGs ordered by me or triage Sinus rhythm, rate 59 bpm no acute ST elevation or depression, PACs noted. ____________________________________________  RADIOLOGY  Pertinent  labs & imaging results that were available during my care of the patient were reviewed by me and considered in my medical decision making (see chart for details). If possible, patient and/or family made aware of any abnormal findings.  Dg Shoulder 1v Right  Result Date: 06/14/2017 CLINICAL DATA:  Right shoulder pain after fall today. EXAM: RIGHT SHOULDER - 1 VIEW COMPARISON:  None. FINDINGS: Moderately displaced fracture is seen involving the proximal right humeral head and neck. No dislocation is noted. Visualized ribs are unremarkable. IMPRESSION: Moderately displaced proximal right humeral head and neck fracture. Electronically Signed   By: Marijo Conception, M.D.   On: 06/14/2017 14:32   Ct Head Wo Contrast  Result Date: 06/14/2017 CLINICAL DATA:  Fall. EXAM: CT HEAD WITHOUT CONTRAST CT CERVICAL SPINE WITHOUT CONTRAST TECHNIQUE: Multidetector CT imaging of the head and cervical spine was performed following the standard protocol without intravenous contrast. Multiplanar CT image reconstructions of the cervical spine were also generated. COMPARISON:  CT head 10/16/2016, CT cervical spine 02/09/2016 FINDINGS: CT HEAD FINDINGS Brain: Moderate atrophy. Chronic microvascular ischemic  changes in the white matter stable Negative for acute infarct, hemorrhage, or mass Vascular: Negative for hyperdense vessel Skull: Negative for skull fracture Sinuses/Orbits: Bilateral cataract removal. Paranasal sinuses clear. Other: None CT CERVICAL SPINE FINDINGS Alignment: Mild anterolisthesis C3-4, C4-5, C6-7. Skull base and vertebrae: Chronic fracture of the dens without bony union unchanged from the prior study. No acute fracture. Soft tissues and spinal canal: Carotid artery atherosclerotic disease. Negative for soft tissue mass or adenopathy in the neck. Disc levels: Multilevel disc and facet degeneration throughout the cervical spine. Large anterior osteophytes on the right in the lower cervical spine. Upper chest: Lung apices clear.  Superior mediastinum negative. Other: None IMPRESSION: 1. Moderate atrophy and chronic microvascular ischemic change. No acute intracranial abnormality 2. Negative for acute fracture cervical spine 3. Chronic fracture of the dens without bony union. Multilevel spondylosis and degenerative change. Electronically Signed   By: Franchot Gallo M.D.   On: 06/14/2017 15:18   Ct Cervical Spine Wo Contrast  Result Date: 06/14/2017 CLINICAL DATA:  Fall. EXAM: CT HEAD WITHOUT CONTRAST CT CERVICAL SPINE WITHOUT CONTRAST TECHNIQUE: Multidetector CT imaging of the head and cervical spine was performed following the standard protocol without intravenous contrast. Multiplanar CT image reconstructions of the cervical spine were also generated. COMPARISON:  CT head 10/16/2016, CT cervical spine 02/09/2016 FINDINGS: CT HEAD FINDINGS Brain: Moderate atrophy. Chronic microvascular ischemic changes in the white matter stable Negative for acute infarct, hemorrhage, or mass Vascular: Negative for hyperdense vessel Skull: Negative for skull fracture Sinuses/Orbits: Bilateral cataract removal. Paranasal sinuses clear. Other: None CT CERVICAL SPINE FINDINGS Alignment: Mild anterolisthesis C3-4,  C4-5, C6-7. Skull base and vertebrae: Chronic fracture of the dens without bony union unchanged from the prior study. No acute fracture. Soft tissues and spinal canal: Carotid artery atherosclerotic disease. Negative for soft tissue mass or adenopathy in the neck. Disc levels: Multilevel disc and facet degeneration throughout the cervical spine. Large anterior osteophytes on the right in the lower cervical spine. Upper chest: Lung apices clear.  Superior mediastinum negative. Other: None IMPRESSION: 1. Moderate atrophy and chronic microvascular ischemic change. No acute intracranial abnormality 2. Negative for acute fracture cervical spine 3. Chronic fracture of the dens without bony union. Multilevel spondylosis and degenerative change. Electronically Signed   By: Franchot Gallo M.D.   On: 06/14/2017 15:18   Dg Humerus Right  Result Date: 06/14/2017 CLINICAL DATA:  Right arm  pain after fall today. EXAM: RIGHT HUMERUS - 2+ VIEW COMPARISON:  None. FINDINGS: Moderately displaced fracture is seen involving the proximal right humeral head and neck. No dislocation is noted. Visualized ribs appear normal. IMPRESSION: Moderately displaced proximal right humeral head and neck fracture. Electronically Signed   By: Marijo Conception, M.D.   On: 06/14/2017 14:33   Dg Hip Unilat W Or Wo Pelvis 2-3 Views Left  Result Date: 06/14/2017 CLINICAL DATA:  [place] today.  Left lateral thigh pain. EXAM: DG HIP (WITH OR WITHOUT PELVIS) 2-3V LEFT COMPARISON:  06/27/2015.  03/22/2015. FINDINGS: No acute finding. Chronic osteoarthritis of the left hip. No evidence of pelvic fracture. IMPRESSION: No acute or traumatic finding. Chronic osteoarthritis of the left hip. Electronically Signed   By: Nelson Chimes M.D.   On: 06/14/2017 15:18   ____________________________________________    PROCEDURES  Procedure(s) performed: None  Procedures  Critical Care performed: None  ____________________________________________   INITIAL  IMPRESSION / ASSESSMENT AND PLAN / ED COURSE  Pertinent labs & imaging results that were available during my care of the patient were reviewed by me and considered in my medical decision making (see chart for details).  She with a proximal humerus fracture after non-syncopal fall, workup otherwise unremarkable, she does have a mild abrasion above the right eye.  Workup only reveals x-ray verified fracture.  Social work will help with home health, signed out at the end of my shift.  ----------------------------------------- 3:59 PM on 06/14/2017 -----------------------------------------  Gust with Dr. Roland Rack, who will see the patient outpatient follow-up and agrees with care. Signed out to dr. Dineen Kid at the end of my shift.    ____________________________________________   FINAL CLINICAL IMPRESSION(S) / ED DIAGNOSES  Final diagnoses:  Closed fracture of proximal end of right humerus, unspecified fracture morphology, initial encounter      This chart was dictated using voice recognition software.  Despite best efforts to proofread,  errors can occur which can change meaning.      Schuyler Amor, MD 06/14/17 1554    Schuyler Amor, MD 06/14/17 1558    Schuyler Amor, MD 06/14/17 1600

## 2017-06-14 NOTE — Discharge Instructions (Addendum)
Take Tylenol or Motrin for the pain if you have breakthrough pain take the Percocet, only take pain medications as prescribed.  If you have increased pain, numbness or weakness, or you have other concerns return to the emergency room.  Follow closely with Dr. Roland Rack and your primary care doctor.

## 2017-06-14 NOTE — ED Provider Notes (Signed)
Patient seen by social work as well as Public house manager.  Patient will likely be able to go to the [facility] this weekend to a higher level of care until she is able to establish home health care this Monday.  Social work to see in the morning.  Samantha, TTS overnight, says that there will likely need to be more paperwork faxed in the morning to confirm the patient at the [facility] for this weekend.   Orbie Pyo, MD 06/14/17 2120

## 2017-06-14 NOTE — ED Triage Notes (Signed)
Pt arrived via ems for c/o fall - she is c/o right shoulder pain and has abrasion above right eye - pt has obv deformity to right shoulder

## 2017-06-14 NOTE — Care Management (Signed)
RNCM notified by CSW that patient will need home health PT arranged for this patient. I have met with patient, husband of over 8 years, and patient's oldest son to discuss transition of care.  She has a three wheeled walker available for use at her POL.  She is from independent living. Her PCP is Dr. Frazier Richards.  I have provided a list of home health agencies and patient deferred to her son to pick one- he picked [facility].  I have notified Corene Cornea with [facility] of this need and that patient is potentially returning to her POL today.  No other RNCM needs.

## 2017-06-14 NOTE — Progress Notes (Signed)
LCSW was called to meet and assess the needs of this patient. It was apparent when they reported they would be returning home that they required PT and an in home support. LCSW called Angie Care Manager and she came down to assess and support the patients in home needs.  LCSW completed brief assessment.  [employer] LCSW [phone]

## 2017-06-14 NOTE — ED Notes (Signed)
Pt moved into a hospital bed.  Brief changed.

## 2017-06-14 NOTE — BH Assessment (Signed)
Pt information was faxed to [facility], as requested by provider, for pt to be admitted into higher care unit while she recovers from injury. Information was faxed Attn: Alphia Kava, Admissions Coordinator. [phone]. Transmission Log verified fax went through 'ok' 06/14/2017 2144.

## 2017-06-14 NOTE — Clinical Social Work Note (Signed)
Clinical Social Work Assessment  Patient Details  Name: [name] MRN: [id number] Date of Birth: March 01, 1924  Date of referral:  06/14/17               Reason for consult:  Care Management Concerns                Permission sought to share information with:  Family Supports, Customer service manager Permission granted to share information::  Yes, Verbal Permission Granted  Name::     Husband HCPOA  Charles Forensic psychologist and [employer] son [phone]  Agency::  [facility]  Relationship::     Contact Information:     Housing/Transportation Living arrangements for the past 2 months:  Charity fundraiser of Information:  Patient, Adult Children, Spouse Patient Interpreter Needed:  None Criminal Activity/Legal Involvement Pertinent to Current Situation/Hospitalization:  No - Comment as needed Significant Relationships:  Adult Children, [place], Other Family Members, Spouse Lives with:  Spouse Do you feel safe going back to the place where you live?  Yes Need for family participation in patient care:  Yes (Comment)  Care giving concerns: TBD   Social Worker assessment / plan:LCSW introduced myself to family and patients husband and obtained verbal consent to speak to her family and retirement home.Called care Manager as this was a home in service referral. [name] is a 84 y.o. female   who was walking with her husband and fell, it was a witnessed fall, non-syncopal, landed on her right shoulder.  Immediate cry it out.  Did not pass out.  Did bump her head.  Not on blood thinners.  Patient is at her baseline mental status at this time.Oriented x4   She does have a history of recurrent frequent falls.  Aside from pain in her right shoulder she has no complaint. Patient and her husband married for 15 years. She has excellent family support her son and daughter in [place]. She follows Dr Ouida Sills in [facility].  She uses a walker and is bit  hard of hearing. Care Manager will assist family with in home supports no further SW needs.     Employment status:  Retired(Used to be Glass blower/designer) Insurance information:  Medicare(Health team advantage) PT Recommendations:  Home with [place] / Referral to community resources:  Other (Comment Required)(PT to be set up)  Patient/Family's Response to care:  Good understanding of in home PT  Patient/Family's Understanding of and Emotional Response to Diagnosis, Current Treatment, and Prognosis:  Patient able to verbalize her wishes.  Emotional Assessment Appearance:  Appears stated age Attitude/Demeanor/Rapport:  Gracious Affect (typically observed):  Accepting, Adaptable, Appropriate Orientation:  Oriented to Self, Oriented to Place, Oriented to  Time, Oriented to Situation Alcohol / Substance use:  Not Applicable Psych involvement (Current and /or in the community):  No (Comment)  Discharge Needs  Concerns to be addressed:  No discharge needs identified Readmission within the last 30 days:  No Current discharge risk:  None Barriers to Discharge:  No Barriers Identified   Joana Reamer, LCSW 06/14/2017, 4:22 PM

## 2017-06-14 NOTE — BH Assessment (Addendum)
[facility] regarding pt being admitted into their care tomorrow after pt is discharged from the ED. Anne Shutter stated it can sometimes take 1-2 days for a pt to be admitted from the hospital back into their facility; inquired as to whether this would be the situation with pt as pt was returning from an injury and was being d/c with medications her husband was planning to pick up independently. Ms. Carlis Abbott was unable to answer this question but stated it would be best for staff to call and inquire in the morning. Inquired as to how pt would be transported & Ms. Carlis Abbott stated pt would be transported via ambulance unless pt's family brought her over independently. Ms. Carlis Abbott advised we contact [facility] tomorrow and stated staff Amy Zenia Resides should be whom is working. [facility] number is [phone]; will provide this information to Social Work for follow-up.

## 2017-06-15 DIAGNOSIS — Z7401 Bed confinement status: Secondary | ICD-10-CM | POA: Diagnosis not present

## 2017-06-15 DIAGNOSIS — R4182 Altered mental status, unspecified: Secondary | ICD-10-CM | POA: Diagnosis not present

## 2017-06-15 NOTE — ED Provider Notes (Signed)
I was able to sign off on the FL to prepared by the Education officer, museum.  Patient accepted and will be discharged/transferred to [facility] today.   Lisa Roca, MD 06/15/17 1037

## 2017-06-15 NOTE — Progress Notes (Signed)
LCSW spoke to [facility] Admissions director:  Patient is to be transported by EMS to [facility] room [address]  Call report number [phone]  LCSW informed ED secretary and EDP patient to d/c to facility.   [employer] LCSW [phone]

## 2017-06-15 NOTE — Progress Notes (Signed)
LCSW called and spoke to Husband of patient and he reported that patient is to go to [facility] and transported by EMS today. LCSW sent via the HUB Fl2 Passr Number and assessment. According to family they are agreeable to private pay but hope insurance will cover this. They are planning to have patient return home with full time help early next week ( [facility] and PT in home as arranged by care manager)  LCSW consulted with EDP and she will sign FL2 called [facility] [phone] spoke to nursing supervisor and awaiting call back.

## 2017-06-15 NOTE — ED Provider Notes (Signed)
-----------------------------------------   6:41 AM on 06/15/2017 -----------------------------------------   Blood pressure (!) 135/41, pulse 72, resp. rate 14, height 5' (1.524 m), weight 59 kg (130 lb), SpO2 96 %.  The patient had no acute events since last update.  Calm and cooperative at this time.    The patient was signed out to me by Dr. Clearnce Hasten at the beginning of the overnight shift.  Per Dr. Clearnce Hasten, the patient has arrangements made to go to the [facility] this weekend to a higher level of care than she had previously.  Per Dr. Clearnce Hasten, there will be some additional paperwork and arrangements for social work to complete in the morning.  The patient is pending disposition per social work/TTS.    Arta Silence, MD 06/15/17 [phone]

## 2017-06-15 NOTE — NC FL2 (Addendum)
  [place] LEVEL OF CARE SCREENING TOOL     IDENTIFICATION  Patient Name: [name] Birthdate: [birth year] Sex: female Admission Date (Current Location): 06/14/2017  [facility] and [place] Number:  Engineering geologist and Address:  [facility], [address], [place], [place] [address]      Provider Number: [id number]  Attending Physician Name and Address:  No att. providers found  Relative Name and Phone Number:       Current Level of Care: Hospital Recommended Level of Care: SNF Prior Approval Number:    Date Approved/Denied:   PASRR Number:   [id number]   Discharge Plan:      Current Diagnoses: Patient Active Problem List   Diagnosis Date Noted  . Severe aortic stenosis   . HTN (hypertension)   . Dyslipidemia   . Hypothyroidism   . CONSTIPATION 06/20/2009    Orientation RESPIRATION BLADDER Height & Weight     Self, Time, Situation, Place  Normal Continent Weight: 130 lb (59 kg) Height:  5' (152.4 cm)  BEHAVIORAL SYMPTOMS/MOOD NEUROLOGICAL BOWEL NUTRITION STATUS      Continent Diet(Normal)  AMBULATORY STATUS COMMUNICATION OF NEEDS Skin   Supervision   Bruising                       Personal Care Assistance Level of Assistance  Bathing, Feeding, Dressing, Total care Bathing Assistance: Limited assistance Feeding assistance: Independent Dressing Assistance: Limited assistance Total Care Assistance: Limited assistance   Functional Limitations Info  Sight, Hearing, Speech Sight Info: Adequate Hearing Info: Impaired Speech Info: Adequate    SPECIAL CARE FACTORS FREQUENCY  PT (By licensed PT)                    Contractures Contractures Info: Not present    Additional Factors Info                  Current Medications (06/15/2017):  This is the current hospital active medication list No current facility-administered medications for this encounter.    Current Outpatient Medications   Medication Sig Dispense Refill  . aspirin EC 81 MG tablet Take 81 mg by mouth daily.    . Calcium Carbonate-Vitamin D (CALCIUM 600+D) 600-400 MG-UNIT tablet Take 1 tablet by mouth 2 (two) times daily.    Marland Kitchen ipratropium (ATROVENT) 0.03 % nasal spray Place 1-2 sprays into both nostrils 2 (two) times daily as needed for rhinitis.   11  . levothyroxine (SYNTHROID, LEVOTHROID) 50 MCG tablet Take 50 mcg by mouth daily before breakfast.     . meloxicam (MOBIC) 7.5 MG tablet Take 7.5 mg by mouth 2 (two) times daily after a meal.    . rosuvastatin (CRESTOR) 20 MG tablet Take 20 mg by mouth daily.    . cephALEXin (KEFLEX) 500 MG capsule Take 1 capsule (500 mg total) by mouth 3 (three) times daily. (Patient not taking: Reported on 05/24/2015) 21 capsule 0  . docusate sodium (COLACE) 100 MG capsule Take 1 capsule (100 mg total) by mouth daily as needed. 30 capsule 2  . oxyCODONE-acetaminophen (PERCOCET) 5-325 MG tablet Take 1 tablet by mouth every 4 (four) hours as needed for severe pain. 12 tablet 0     Discharge Medications: Please see discharge summary for a list of discharge medications.  Relevant Imaging Results:  Relevant Lab Results:   Additional Information SSN [id number]  Joana Reamer, [place]

## 2017-06-15 NOTE — ED Notes (Signed)
PT unable to sign due to mental status. Bedside report given to [facility]. [facility].

## 2017-06-17 NOTE — Care Management (Signed)
Received call from Domenic Polite [phone] at [facility] inquiring if home health PT had been established for this patient. She will be discharging from [facility] today. I contacted Corene Cornea with [facility]. They can see patient on Wednesday. I left message with Domenic Polite with the home health plan.

## 2017-06-18 DIAGNOSIS — I10 Essential (primary) hypertension: Secondary | ICD-10-CM | POA: Diagnosis not present

## 2017-06-18 DIAGNOSIS — I350 Nonrheumatic aortic (valve) stenosis: Secondary | ICD-10-CM | POA: Diagnosis not present

## 2017-06-18 DIAGNOSIS — S42301A Unspecified fracture of shaft of humerus, right arm, initial encounter for closed fracture: Secondary | ICD-10-CM | POA: Diagnosis not present

## 2017-06-18 DIAGNOSIS — R2681 Unsteadiness on feet: Secondary | ICD-10-CM | POA: Diagnosis not present

## 2017-06-19 ENCOUNTER — Emergency Department: Payer: PPO

## 2017-06-19 ENCOUNTER — Other Ambulatory Visit: Payer: Self-pay

## 2017-06-19 ENCOUNTER — Emergency Department
Admission: EM | Admit: 2017-06-19 | Discharge: 2017-06-20 | Disposition: A | Discharge status: Home / Self Care | Payer: PPO | Attending: Emergency Medicine | Admitting: Emergency Medicine

## 2017-06-19 DIAGNOSIS — Y9389 Activity, other specified: Secondary | ICD-10-CM | POA: Diagnosis not present

## 2017-06-19 DIAGNOSIS — S42301A Unspecified fracture of shaft of humerus, right arm, initial encounter for closed fracture: Secondary | ICD-10-CM | POA: Diagnosis not present

## 2017-06-19 DIAGNOSIS — S42351A Displaced comminuted fracture of shaft of humerus, right arm, initial encounter for closed fracture: Secondary | ICD-10-CM | POA: Diagnosis not present

## 2017-06-19 DIAGNOSIS — Z7982 Long term (current) use of aspirin: Secondary | ICD-10-CM | POA: Diagnosis not present

## 2017-06-19 DIAGNOSIS — I10 Essential (primary) hypertension: Secondary | ICD-10-CM | POA: Diagnosis not present

## 2017-06-19 DIAGNOSIS — Z79899 Other long term (current) drug therapy: Secondary | ICD-10-CM | POA: Diagnosis not present

## 2017-06-19 DIAGNOSIS — S4991XA Unspecified injury of right shoulder and upper arm, initial encounter: Secondary | ICD-10-CM | POA: Diagnosis present

## 2017-06-19 DIAGNOSIS — R600 Localized edema: Secondary | ICD-10-CM | POA: Diagnosis not present

## 2017-06-19 DIAGNOSIS — Z87891 Personal history of nicotine dependence: Secondary | ICD-10-CM | POA: Insufficient documentation

## 2017-06-19 DIAGNOSIS — W19XXXA Unspecified fall, initial encounter: Secondary | ICD-10-CM | POA: Diagnosis not present

## 2017-06-19 DIAGNOSIS — E039 Hypothyroidism, unspecified: Secondary | ICD-10-CM | POA: Insufficient documentation

## 2017-06-19 DIAGNOSIS — M25511 Pain in right shoulder: Secondary | ICD-10-CM | POA: Diagnosis not present

## 2017-06-19 DIAGNOSIS — M6281 Muscle weakness (generalized): Secondary | ICD-10-CM | POA: Insufficient documentation

## 2017-06-19 DIAGNOSIS — Y92129 Unspecified place in nursing home as the place of occurrence of the external cause: Secondary | ICD-10-CM | POA: Insufficient documentation

## 2017-06-19 DIAGNOSIS — R262 Difficulty in walking, not elsewhere classified: Secondary | ICD-10-CM | POA: Insufficient documentation

## 2017-06-19 DIAGNOSIS — S0990XA Unspecified injury of head, initial encounter: Secondary | ICD-10-CM | POA: Diagnosis not present

## 2017-06-19 DIAGNOSIS — R5381 Other malaise: Secondary | ICD-10-CM | POA: Diagnosis not present

## 2017-06-19 DIAGNOSIS — Y999 Unspecified external cause status: Secondary | ICD-10-CM | POA: Insufficient documentation

## 2017-06-19 HISTORY — DX: Unspecified dementia, unspecified severity, without behavioral disturbance, psychotic disturbance, mood disturbance, and anxiety: F03.90

## 2017-06-19 MED ORDER — HALOPERIDOL 5 MG PO TABS: 5.0000 mg | ORAL_TABLET | Freq: Once | ORAL | Status: DC

## 2017-06-19 MED ORDER — OXYCODONE-ACETAMINOPHEN 5-325 MG PO TABS
1.0000 | ORAL_TABLET | Freq: Once | ORAL | Status: AC
  Administered 2017-06-19: 1 via ORAL
  Filled 2017-06-19: qty 1

## 2017-06-19 NOTE — NC FL2 (Signed)
[place] LEVEL OF CARE SCREENING TOOL     IDENTIFICATION  Patient Name: [name] Birthdate: [birth year] Sex: female Admission Date (Current Location): 06/19/2017  [place] and [place] Number:  Engineering geologist and Address:  [facility], [address] [place], [place] [address]      Provider Number: [id number]  Attending Physician Name and Address:  No att. providers found  Relative Name and Phone Number:  Norva Pavlov Chip Plucinski [phone](L) [phone])  Spouse Chales S. Krutz [phone]    Current Level of Care: Hospital Recommended Level of Care: [place] Prior Approval Number:    Date Approved/Denied:   PASRR Number: [id number]  Discharge Plan: SNF    Current Diagnoses: Patient Active Problem List   Diagnosis Date Noted  . Severe aortic stenosis   . HTN (hypertension)   . Dyslipidemia   . Hypothyroidism   . CONSTIPATION 06/20/2009    Orientation RESPIRATION BLADDER Height & Weight     Self  Normal Continent Weight: 125 lb (56.7 kg) Height:  5\' 1"  (154.9 cm)  BEHAVIORAL SYMPTOMS/MOOD NEUROLOGICAL BOWEL NUTRITION STATUS      Continent Diet(Normal)  AMBULATORY STATUS COMMUNICATION OF NEEDS Skin   Extensive Assist Verbally Bruising                       Personal Care Assistance Level of Assistance  Bathing, Feeding, Dressing Bathing Assistance: Limited assistance Feeding assistance: Limited assistance Dressing Assistance: Limited assistance     Functional Limitations Info  Sight, Hearing, Speech Sight Info: Adequate Hearing Info: Adequate Speech Info: Adequate    SPECIAL CARE FACTORS FREQUENCY  PT (By licensed PT), OT (By licensed OT)     PT Frequency: 5x OT Frequency: 5x            Contractures Contractures Info: Not present    Additional Factors Info  Code Status, Allergies Code Status Info: Full Allergies Info: No Known Allergies           Current  Medications (06/19/2017):  This is the current hospital active medication list No current facility-administered medications for this encounter.    Current Outpatient Medications  Medication Sig Dispense Refill  . aspirin EC 81 MG tablet Take 81 mg by mouth daily.    Marland Kitchen atorvastatin (LIPITOR) 80 MG tablet Take 1 tablet by mouth at bedtime.    . Calcium Carbonate-Vitamin D (CALCIUM 600+D) 600-400 MG-UNIT tablet Take 1 tablet by mouth 2 (two) times daily.    Marland Kitchen docusate sodium (COLACE) 100 MG capsule Take 1 capsule (100 mg total) by mouth daily as needed. 30 capsule 2  . ipratropium (ATROVENT) 0.03 % nasal spray Place 1-2 sprays into both nostrils 2 (two) times daily as needed for rhinitis.   11  . levothyroxine (SYNTHROID, LEVOTHROID) 50 MCG tablet Take 50 mcg by mouth daily before breakfast.     . meloxicam (MOBIC) 7.5 MG tablet Take 7.5 mg by mouth 2 (two) times daily after a meal.    . omeprazole (PRILOSEC) 20 MG capsule Take 20 mg by mouth daily.    Marland Kitchen oxyCODONE-acetaminophen (PERCOCET) 5-325 MG tablet Take 1 tablet by mouth every 4 (four) hours as needed for severe pain. 12 tablet 0  . rosuvastatin (CRESTOR) 20 MG tablet Take 20 mg by mouth daily.       Discharge Medications: Please see discharge summary for a list of discharge medications.  Relevant Imaging Results:  Relevant Lab Results:  Additional Information SSN: [id number]  Truitt Merle, LCSW

## 2017-06-19 NOTE — ED Provider Notes (Signed)
[facility] Emergency Department Provider Note    First MD Initiated Contact with Patient 06/19/17 0145     (approximate)  I have reviewed the triage vital signs and the nursing notes.   HISTORY  Chief Complaint Fall  HPI [name] is a 84 y.o. female with below list of chronic medical conditions including recent hospitalization 06/14/2017 secondary to fall with resultant right proximal humerus fracture presents to the emergency department tonight via EMS with reported history that the patient's husband signed her out of [facility] rehab facility.  EMS states that the patient subsequently went home and had 2 additional falls tonight.  Patient states that both falls were onto her right side.  Patient admits to head injury.  Patient admits to worsening right arm pain.  EMS states on their arrival that patient did not have shoulder immobilizer in place.  Patient states that pain worse with any movement of the right arm   Past Medical History:  Diagnosis Date  . Arthritis   . Dyslipidemia   . HTN (hypertension)   . Hypothyroidism   . Severe aortic stenosis     Patient Active Problem List   Diagnosis Date Noted  . Severe aortic stenosis   . HTN (hypertension)   . Dyslipidemia   . Hypothyroidism   . CONSTIPATION 06/20/2009    Past Surgical History:  Procedure Laterality Date  . AORTIC VALVE REPLACEMENT  1999  . CYST EXCISION  20 years ago   both breast had cyst removed  . TOTAL ABDOMINAL HYSTERECTOMY     Uterine cancer    Prior to Admission medications   Medication Sig Start Date End Date Taking? Authorizing Provider  aspirin EC 81 MG tablet Take 81 mg by mouth daily.   Yes [provider]  atorvastatin (LIPITOR) 80 MG tablet Take 1 tablet by mouth at bedtime. 06/17/17  Yes [provider]  Calcium Carbonate-Vitamin D (CALCIUM 600+D) 600-400 MG-UNIT tablet Take 1 tablet by mouth 2 (two) times daily.   Yes [provider]  docusate sodium (COLACE) 100 MG capsule Take 1 capsule (100 mg total) by mouth daily as needed. 06/14/17 06/14/18 Yes McShane, Gerda Diss, MD  ipratropium (ATROVENT) 0.03 % nasal spray Place 1-2 sprays into both nostrils 2 (two) times daily as needed for rhinitis.    Yes [provider]  levothyroxine (SYNTHROID, LEVOTHROID) 50 MCG tablet Take 50 mcg by mouth daily before breakfast.    Yes [provider]  meloxicam (MOBIC) 7.5 MG tablet Take 7.5 mg by mouth 2 (two) times daily after a meal.   Yes [provider]  omeprazole (PRILOSEC) 20 MG capsule Take 20 mg by mouth daily. 06/17/17  Yes [provider]  oxyCODONE-acetaminophen (PERCOCET) 5-325 MG tablet Take 1 tablet by mouth every 4 (four) hours as needed for severe pain. 06/14/17  Yes Schuyler Amor, MD  rosuvastatin (CRESTOR) 20 MG tablet Take 20 mg by mouth daily.   Yes [provider]    Allergies No known drug allergies  Family History  Problem Relation Age of Onset  . Heart disease Father   . Heart disease Mother     Social History Social History   Tobacco Use  . Smoking status: Former Smoker    Types: Cigarettes    Last attempt to quit: 06/20/1995    Years since quitting: 22.0  . Smokeless tobacco: Never Used  Substance Use Topics  . Alcohol use: Yes  Comment: Occ  . Drug use: No    Review of Systems Constitutional: No fever/chills Eyes: No visual changes. ENT: No sore throat. Cardiovascular: Denies chest pain. Respiratory: Denies shortness of breath. Gastrointestinal: No abdominal pain.  No nausea, no vomiting.  No diarrhea.  No constipation. Genitourinary: Negative for dysuria. Musculoskeletal: Negative for neck pain.  Negative for back pain.  Positive for right shoulder pain Integumentary: Negative for rash. Neurological: Negative for headaches, focal weakness or numbness.   ____________________________________________   PHYSICAL EXAM:  VITAL SIGNS: ED  Triage Vitals  Enc Vitals Group     BP 06/19/17 0207 (!) 129/46     Pulse Rate 06/19/17 0207 75     Resp 06/19/17 0207 20     Temp 06/19/17 0207 97.9 F (36.6 C)     Temp Source 06/19/17 0207 Oral     SpO2 06/19/17 0207 98 %     Weight 06/19/17 0208 56.7 kg (125 lb)     Height 06/19/17 0208 1.549 m (5\' 1" )     Head Circumference --      Peak Flow --      Pain Score 06/19/17 0208 0     Pain Loc --      Pain Edu? --      Excl. in [place]? --    Constitutional: Alert and oriented. Well appearing and in no acute distress. Eyes: Conjunctivae are normal. PERRL. EOMI. Head: Right temporal contusion Mouth/Throat: Mucous membranes are moist. Oropharynx non-erythematous. Neck: No stridor.  No cervical spine tenderness to palpation. Cardiovascular: Normal rate, regular rhythm. Good peripheral circulation. Grossly normal heart sounds. Respiratory: Normal respiratory effort.  No retractions. Lungs CTAB. Gastrointestinal: Soft and nontender. No distention.  Musculoskeletal: Proximal right humerus discomfort extensive ecchymoses extending from the arm to the distal forearm. Neurologic:  Normal speech and language. No gross focal neurologic deficits are appreciated.  Skin:  Skin is warm, dry and intact. No rash noted. Psychiatric: Mood and affect are normal. Speech and behavior are normal.  RADIOLOGY I, [place], personally viewed and evaluated these images (plain radiographs) as part of my medical decision making, as well as reviewing the written report by the radiologist.  ED MD interpretation: Crease displacement of the previously noted right comminuted proximal humerus fracture.  Official radiology report(s): Dg Shoulder Right  Result Date: 06/19/2017 CLINICAL DATA:  Right shoulder pain after 2 falls tonight. Recent fracture. EXAM: RIGHT SHOULDER - 2+ VIEW COMPARISON:  Radiograph 06/14/2017 FINDINGS: Comminuted proximal humerus fracture involving the lateral head and neck has increased  displacement compared to prior exam. Humeral shaft is displaced approximately 1 shaft with anteriorly. Humeral head is likely located. Degenerative change at the acromioclavicular joint again seen. IMPRESSION: The comminuted proximal humerus involving the lateral head and neck fracture demonstrates increased displacement compared to prior exam. Electronically Signed   By: Jeb Levering M.D.   On: 06/19/2017 03:34   Dg Forearm Right  Result Date: 06/19/2017 CLINICAL DATA:  Right forearm pain after multiple falls tonight. EXAM: RIGHT FOREARM - 2 VIEW COMPARISON:  None. FINDINGS: There is no evidence of fracture or other focal bone lesions. Degenerative change at the base of the thumb and carpal bones. There is soft tissue edema proximally. IMPRESSION: Soft tissue edema without acute forearm fracture. Electronically Signed   By: Jeb Levering M.D.   On: 06/19/2017 03:36   Ct Head Wo Contrast  Result Date: 06/19/2017 CLINICAL DATA:  Patient fell twice this evening. Ecchymosis of the right arm. Bruising  noted to right eye. EXAM: CT HEAD WITHOUT CONTRAST TECHNIQUE: Contiguous axial images were obtained from the base of the skull through the vertex without intravenous contrast. COMPARISON:  06/14/2017 FINDINGS: Brain: Sulcal and ventricular prominence, stable in appearance consistent with moderate atrophy. Moderate degree of small vessel ischemia is redemonstrated with chronic right caudate head lacunar infarct. No acute intracranial hemorrhage, intra-axial mass nor extra-axial fluid. No large vascular territory infarct. Vascular: No hyperdense vessel sign. Skull: Mild hyperostosis frontalis interna. No acute fracture or suspicious osseous lesions. Sinuses/Orbits: Bilateral cataract extractions. Clear paranasal sinuses. Clear mastoids. Other: None IMPRESSION: Chronic stable atrophy and moderate small vessel ischemia. No acute intracranial abnormality. Electronically Signed   By: Ashley Royalty M.D.   On:  06/19/2017 03:50       Procedures   ____________________________________________   INITIAL IMPRESSION / ASSESSMENT AND PLAN / ED COURSE  As part of my medical decision making, I reviewed the following data within the electronic MEDICAL RECORD NUMBER   84 year old female presented with above-stated history and physical exam secondary to "slip and fall".  X-ray revealed crease displacement of right comminuted humeral fracture.  Social work consult will be placed at this time patient's husband never presented to the emergency department or any other family member ____________________________________________  FINAL CLINICAL IMPRESSION(S) / ED DIAGNOSES  Final diagnoses:  Closed displaced comminuted fracture of shaft of right humerus, initial encounter     MEDICATIONS GIVEN DURING THIS VISIT:  Medications - No data to display   ED Discharge Orders    None       Note:  This document was prepared using Dragon voice recognition software and may include unintentional dictation errors.    Gregor Hams, MD 06/19/17 [phone]

## 2017-06-19 NOTE — ED Notes (Signed)
Pt pulling at shoulder immobilizer at this time.  Pt somewhat redirectable.  Per family request, pt placed in mitten to keep from being able to pull off shoulder immobilizer strap around forearm.

## 2017-06-19 NOTE — ED Notes (Signed)
Pt placed on bed alarm, yellow arm band and socks applied to pt. Bed in lowest position, arm rails up and call bell placed to bed with pt return demonstration of how to use call bell when needing to get up. Pt verbalizes understanding of this.

## 2017-06-19 NOTE — ED Notes (Signed)
This RN called and spoke to pt's husband Juanda Crumble. Juanda Crumble states he will be coming to the ED in the next half hour. Juanda Crumble also states pt was removed from rehab due to "appointment at 12:30 today with [facility] staff" in regards to plan of care for pt due to recent falls. Charles states "I thought she would be ok to come home but she wouldn't follow instructions or do what she was told because of her shoulder and kept falling." Juanda Crumble stated pt "does have dementia and it's pretty bad." Juanda Crumble notified pt has a consult for social work order in this ED and will be following up with a Education officer, museum. Juanda Crumble states he will be here "in about half an hour."

## 2017-06-19 NOTE — ED Notes (Signed)
Patient transported to CT 

## 2017-06-19 NOTE — ED Notes (Signed)
Pt provided breakfast tray.

## 2017-06-19 NOTE — ED Triage Notes (Addendum)
Per EMS, pt from [facility] independent apartments with reports of 2 falls tonight after her husband signed her out of [facility] 06/18/17. EMS reports husband called EMS and on arrival pt did not have right arm in immobilizer from prior fall and shoulder fracture. Pt presents with gross amount of ecchymosis to right arm that is dark purple to yellow in coloration that ranges from distal forearm to shoulder. Pt also has bruising noted to right eye. Pt arrives with right arm in shoulder immobilizer.

## 2017-06-19 NOTE — ED Notes (Signed)
EDP to bedside to provide patient's family with update.

## 2017-06-19 NOTE — Clinical Social Work Note (Addendum)
CSW received consult for "Right proximal humerus fracture. Patient was signed out of [facility] by her husband and subsequently went home and sustained 2 falls last night." CSW met with patient's son "Chip" who stated patient lives at [place] with spouse. Son's understanding is that patient was taken out of rehab at [facility] by patient's spouse AMA yesterday and subsequently had 2 falls that evening at home. Family is wanting patient to return to rehab at [facility] if possible.   CSW spoke with Neoma Laming in admissions at [facility]. Neoma Laming stated patient was NOT there for rehab, but paid privately to stay for 2 days until home health was able to start in the home for patient. Per Neoma Laming, it was believed that home health PT was sufficient for patient's treatment. CSW updated Dr. Jimmye Norman and requested P/T consult. CSW updated family that we are awaiting P/T eval and the process for STR vs HHPT. Family concerned about the amount of time patient has been here thus far. CSW informed that P/T consult has been placed and we are awaiting the evaluation. However, can take patient home and/or make alternative arrangements for discharge at any time.   2:21pm- Received call from [facility] in PT stating recommendation is SNF. CSW spoke with Tammy at [facility] to initiate insurance auth. CSW updated Neoma Laming at [facility], RN [place], son, and Dr. Mariea Clonts. Awaiting insurance auth. CSW continuing to follow for transitions of care/discharge planning.   Oretha Ellis, Latanya Presser, [place] Social Worker-ED [phone]

## 2017-06-19 NOTE — ED Notes (Signed)
Pt's husband called this RN into room.  Pt naked in room.  Attempted to redress patient.  Pt trying to get out of bed and take off brace.  Pt's husband states he is going home.  Agricultural consultant and EDP notified for Air cabin crew order.

## 2017-06-19 NOTE — Evaluation (Addendum)
Physical Therapy Evaluation Patient Details Name: [name] MRN: [id number] DOB: 1923/10/19 Today's Date: 06/19/2017   History of Present Illness  presented to ER secondary to fall with worsening injury to R shoulder (displaced, comminuted fracture of R shoulder). Of note, patient initial injury sustained after fall on 3/15, recommended for conservative management/outpatient follow up per chart.  Patient now returns with additional falls and worsened displacement/presentation of R shoulder fracture. Per discussion with attending, no plans for ortho consult at this time.  Clinical Impression  Upon evaluation, patient alert and oriented to self only; follows simple commands, but with frequent encouragement/redirection to task.  RUE immobilized in sling, but patient with limited comprehension of precautions (NWB, immobilization); constant reinforcement for adherence.  Currently requiring max assist +2 for bed mobility; min assist for unsupported sitting balance; mod assist +2 for sit/stand, basic transfers and short-distance gait (30') with L HHA.  Poor dynamic balance, poor righting reactions and overall awareness of safety needs.  Very high fall risk. Unsafe to attempt without constant hands-on assist at this time. Would benefit from skilled PT to address above deficits and promote optimal return to PLOF; recommend transition to STR upon discharge from acute hospitalization.     Follow Up Recommendations SNF    Equipment Recommendations       Recommendations for Other Services       Precautions / Restrictions Precautions Precautions: Fall Restrictions Weight Bearing Restrictions: Yes RUE Weight Bearing: Non weight bearing      Mobility  Bed Mobility Overal bed mobility: Needs Assistance Bed Mobility: Supine to Sit;Sit to Supine     Supine to sit: Max assist;+2 for physical assistance Sit to supine: Max assist;+2 for physical assistance   General bed mobility comments:  extensive assist to initiate movement, protect R UE with movement transition  Transfers Overall transfer level: Needs assistance Equipment used: 1 person hand held assist Transfers: Sit to/from Stand Sit to Stand: Mod assist;+2 safety/equipment         General transfer comment: heavy posterior trunk lean requiring constant assist for correction to midline in A/P plane  Ambulation/Gait Ambulation/Gait assistance: Mod assist;+2 safety/equipment Ambulation Distance (Feet): 30 Feet Assistive device: 1 person hand held assist       General Gait Details: very short, shuffling steps with poor balance reactions; very high fall risk.  Unaware of limitations and balance  Stairs            Wheelchair Mobility    Modified Rankin (Stroke Patients Only)       Balance Overall balance assessment: Needs assistance Sitting-balance support: No upper extremity supported;Feet supported Sitting balance-Leahy Scale: Poor     Standing balance support: No upper extremity supported Standing balance-Leahy Scale: Zero                               Pertinent Vitals/Pain Pain Assessment: Faces Faces Pain Scale: Hurts little more Pain Location: R shoulder Pain Descriptors / Indicators: Aching;Grimacing;Guarding Pain Intervention(s): Limited activity within patient's tolerance;Monitored during session;Repositioned    Home Living Family/patient expects to be discharged to:: Private residence Living Arrangements: Spouse/significant other Available Help at Discharge: Family Type of Home: House Home Access: Level entry     Home Layout: One level Home Equipment: (3WRW)      Prior Function Level of Independence: Independent with assistive device(s)         Comments: Mod indep with 3WRW for ADLs, household mobilization; endorses at  least 3 falls within previous week.  Husband unable to adequately provide physical assist required in home environment (due to recent R  shoulder fracture)     Hand Dominance   Dominant Hand: Right    Extremity/Trunk Assessment   Upper Extremity Assessment Upper Extremity Assessment: (R UE immobilized in sling; notably bruised)    Lower Extremity Assessment Lower Extremity Assessment: Generalized weakness(grossly at least 4-/5 throughout bilat LEs)       Communication   Communication: No difficulties  Cognition Arousal/Alertness: Awake/alert Behavior During Therapy: Impulsive Overall Cognitive Status: History of cognitive impairments - at baseline                                 General Comments: oriented to self only; unaware of injury, situation, safety concerns and level of care required; unaware of R UE injury and associated precautions (NWB, immobilization)      General Comments      Exercises Other Exercises Other Exercises: Sit/stand and static standing balance, mod assist +2 with L HHA; limited spotnaneous balance reactions.  very high risk for posterior LOB Other Exercises: Rolling towards L, max/dep assist +1; dep for placement of bedpan.  Constant verbal cuing for awareness/recall of task at hand   Assessment/Plan    PT Assessment Patient needs continued PT services  PT Problem List Decreased strength;Decreased range of motion;Decreased cognition;Decreased balance;Decreased activity tolerance;Decreased mobility;Decreased coordination;Decreased knowledge of use of DME;Decreased safety awareness;Decreased knowledge of precautions;Pain       PT Treatment Interventions DME instruction;Gait training;Therapeutic exercise;Therapeutic activities;Balance training;Functional mobility training;Cognitive remediation;Patient/family education    PT Goals (Current goals can be found in the Care Plan section)  Acute Rehab PT Goals Patient Stated Goal: per family, to transition to rehab PT Goal Formulation: With patient/family Time For Goal Achievement: 07/03/17 Potential to Achieve Goals:  Fair    Frequency 7X/week   Barriers to discharge Decreased caregiver support      Co-evaluation               AM-PAC PT "6 Clicks" Daily Activity  Outcome Measure Difficulty turning over in bed (including adjusting bedclothes, sheets and blankets)?: Unable Difficulty moving from lying on back to sitting on the side of the bed? : Unable Difficulty sitting down on and standing up from a chair with arms (e.g., wheelchair, bedside commode, etc,.)?: Unable Help needed moving to and from a bed to chair (including a wheelchair)?: A Lot Help needed walking in hospital room?: A Lot Help needed climbing 3-5 steps with a railing? : Total 6 Lamping Score: 8    End of Session Equipment Utilized During Treatment: Gait belt(R UE shoulder immobilizer) Activity Tolerance: Patient tolerated treatment well Patient left: in bed;with call bell/phone within reach;with bed alarm set Nurse Communication: Mobility status PT Visit Diagnosis: Muscle weakness (generalized) (M62.81);Difficulty in walking, not elsewhere classified (R26.2);Pain Pain - Right/Left: Right Pain - part of body: Shoulder    Time: 1339-1405 PT Time Calculation (min) (ACUTE ONLY): 26 min   Charges:   PT Evaluation $PT Eval Moderate Complexity: 1 Mod PT Treatments $Therapeutic Activity: 8-22 mins   PT G Codes:        Ermelinda Eckert H. Owens Shark, PT, DPT, NCS 06/19/17, 2:18 PM [phone]

## 2017-06-19 NOTE — ED Notes (Signed)
Family repeatedly to desk asked for updates on when PT coming to see patient.  Educated husband and son that PT is aware and was in meeting until 1pm.  Educated that this RN unable to give exact time.

## 2017-06-19 NOTE — ED Notes (Signed)
Family asking for patient to have pain medication after PT.  EDP notified and VO given for percocet 5/325 mg PO.

## 2017-06-20 DIAGNOSIS — M1388 Other specified arthritis, other site: Secondary | ICD-10-CM | POA: Diagnosis not present

## 2017-06-20 DIAGNOSIS — R4701 Aphasia: Secondary | ICD-10-CM | POA: Diagnosis not present

## 2017-06-20 DIAGNOSIS — S42201D Unspecified fracture of upper end of right humerus, subsequent encounter for fracture with routine healing: Secondary | ICD-10-CM | POA: Diagnosis not present

## 2017-06-20 DIAGNOSIS — S42201A Unspecified fracture of upper end of right humerus, initial encounter for closed fracture: Secondary | ICD-10-CM | POA: Diagnosis not present

## 2017-06-20 DIAGNOSIS — M6281 Muscle weakness (generalized): Secondary | ICD-10-CM | POA: Diagnosis not present

## 2017-06-20 DIAGNOSIS — Z743 Need for continuous supervision: Secondary | ICD-10-CM | POA: Diagnosis not present

## 2017-06-20 DIAGNOSIS — R2681 Unsteadiness on feet: Secondary | ICD-10-CM | POA: Diagnosis not present

## 2017-06-20 DIAGNOSIS — S42301A Unspecified fracture of shaft of humerus, right arm, initial encounter for closed fracture: Secondary | ICD-10-CM | POA: Diagnosis not present

## 2017-06-20 DIAGNOSIS — S42351A Displaced comminuted fracture of shaft of humerus, right arm, initial encounter for closed fracture: Secondary | ICD-10-CM | POA: Diagnosis not present

## 2017-06-20 DIAGNOSIS — I10 Essential (primary) hypertension: Secondary | ICD-10-CM | POA: Diagnosis not present

## 2017-06-20 DIAGNOSIS — I350 Nonrheumatic aortic (valve) stenosis: Secondary | ICD-10-CM | POA: Diagnosis not present

## 2017-06-20 DIAGNOSIS — S42351D Displaced comminuted fracture of shaft of humerus, right arm, subsequent encounter for fracture with routine healing: Secondary | ICD-10-CM | POA: Diagnosis not present

## 2017-06-20 DIAGNOSIS — F05 Delirium due to known physiological condition: Secondary | ICD-10-CM | POA: Diagnosis not present

## 2017-06-20 MED ORDER — ROSUVASTATIN CALCIUM 10 MG PO TABS
10.0000 mg | ORAL_TABLET | Freq: Once | ORAL | Status: AC
  Administered 2017-06-20: 10 mg via ORAL
  Filled 2017-06-20: qty 1

## 2017-06-20 NOTE — ED Notes (Signed)
Pt placed in yellow socks. Has mitts on because per report was pulling at sling.  Oriented to person only at this time. Has telesitter in place.  Fall mats at bedside. No needs currently.

## 2017-06-20 NOTE — ED Notes (Signed)
This RN to bedside, introduced self to patient and husband. Pt's husband asking about delay in patient going to [facility]. This RN explained delay and offered for Social Worker to come to bedside to explain. Pt husband states he would like that. Social worker notified patient's husband would like to speak with her.

## 2017-06-20 NOTE — ED Notes (Signed)
This RN and Claiborne Billings, RN to bedside due to patient's husband calling out that patient needed a nurse. Pt found to have wiggled herself down in the bed, pt's husband reports pt needs to go to bathroom. Pt's brief checked, found to not be soiled, pt repositioned in the bed. Pt lights dimmed for patient comfort. Alena Bills, updated and informed that patient's husband requesting to speak with her regarding patient's status. Will continue to monitor for further patient needs.

## 2017-06-20 NOTE — Progress Notes (Signed)
Physical Therapy Treatment Patient Details Name: [name] MRN: [id number] DOB: [birth year] Today's Date: 06/20/2017    History of Present Illness presented to ER secondary to fall with worsening injury to R shoulder (displaced, comminuted fracture of R shoulder). Of note, patient initial injury sustained after fall on 3/15, recommended for conservative management/outpatient follow up per chart.  Patient now returns with additional falls and worsened displacement/presentation of R shoulder fracture.    PT Comments    Pt sleeping upon arrival but awoke with ease.  To edge of bed with min a x 1 with good assist from pt.  She stood with mod a x 1 and was able to ambulate with HHA x 1 and min guard x 1 for safety.  Pt with increased ambulation distance today with decreased assist but remains at high fall risk.  Pt with poor safety awareness and upon approach to bed stubbed toe on lowered bedrail.  She had difficulty transitioning back to sitting at bed needing verbal and tactile cues to sit.  Max a x 2 to reposition in bed for comfort.  She reported back pain during gait.   Follow Up Recommendations  SNF     Equipment Recommendations       Recommendations for Other Services       Precautions / Restrictions Precautions Precautions: Fall Restrictions Weight Bearing Restrictions: Yes RUE Weight Bearing: Non weight bearing    Mobility  Bed Mobility Overal bed mobility: Needs Assistance Bed Mobility: Supine to Sit;Sit to Supine     Supine to sit: Min assist Sit to supine: Min assist;+2 for physical assistance   General bed mobility comments: overall improved today with less assist  Transfers Overall transfer level: Needs assistance Equipment used: 1 person hand held assist Transfers: Sit to/from Stand Sit to Stand: Mod assist         General transfer comment: significantly improved today but remains unsteady  Ambulation/Gait Ambulation/Gait assistance: Min assist;+2  safety/equipment Ambulation Distance (Feet): 80 Feet Assistive device: 2 person hand held assist;1 person hand held assist Gait Pattern/deviations: Step-through pattern;Decreased stance time - right;Decreased stance time - left;Shuffle;Narrow base of support   Gait velocity interpretation: <1.8 ft/sec, indicative of risk for recurrent falls     Stairs            Wheelchair Mobility    Modified Rankin (Stroke Patients Only)       Balance Overall balance assessment: Needs assistance Sitting-balance support: No upper extremity supported;Feet supported Sitting balance-Leahy Scale: Fair     Standing balance support: No upper extremity supported Standing balance-Leahy Scale: Poor                              Cognition Arousal/Alertness: Awake/alert Behavior During Therapy: WFL for tasks assessed/performed Overall Cognitive Status: History of cognitive impairments - at baseline                                        Exercises      General Comments        Pertinent Vitals/Pain Pain Assessment: Faces Faces Pain Scale: Hurts even more Pain Location: R shoulder Pain Descriptors / Indicators: Aching;Grimacing;Guarding Pain Intervention(s): Limited activity within patient's tolerance;Monitored during session    Home Living  Prior Function            PT Goals (current goals can now be found in the care plan section) Progress towards PT goals: Progressing toward goals    Frequency    7X/week      PT Plan      Co-evaluation              AM-PAC PT "6 Clicks" Daily Activity  Outcome Measure  Difficulty turning over in bed (including adjusting bedclothes, sheets and blankets)?: Unable Difficulty moving from lying on back to sitting on the side of the bed? : Unable Difficulty sitting down on and standing up from a chair with arms (e.g., wheelchair, bedside commode, etc,.)?: Unable Help needed  moving to and from a bed to chair (including a wheelchair)?: A Little Help needed walking in hospital room?: A Little Help needed climbing 3-5 steps with a railing? : Total 6 Sellinger Score: 10    End of Session Equipment Utilized During Treatment: Gait belt Activity Tolerance: Patient tolerated treatment well Patient left: in bed;with call bell/phone within reach;with bed alarm set Nurse Communication: Mobility status Pain - Right/Left: Right Pain - part of body: Shoulder     Time: 6945-0388 PT Time Calculation (min) (ACUTE ONLY): 9 min  Charges:  $Gait Training: 8-22 mins                    G Codes:       Chesley Noon, PTA 06/20/17, 11:32 AM

## 2017-06-20 NOTE — ED Notes (Signed)
This RN to bedside, pt placed into clean brief and linen changed. No change in patient condition. Will continue to monitor for further patient needs.

## 2017-06-20 NOTE — Clinical Social Work Note (Addendum)
CSW left voicemail for [facility] on-call RN at [phone]. CSW informed by RN [name] patient's husband wants to speak to CSW. CSW met with patient and spouse at bedside to provide update. CSW informed spouse that we are still awaiting insurance authorization and cannot transport patient to [facility] until it's received. Spouse anxious for patient to be placed, but expressed understanding. CSW received a call back from [place] at [facility] stating [name] is still working on insurance authorization and will get back to [place]. CSW updated patient's spouse, RN [name], [name], and [name] in admissions at [facility].   2:45pm- CSW informed by [name] patient spouse wanting update. CSW met with patient's spouse and informed that we are still awaiting insurance auth. Spouse asked what number (insurance) he would need to call to "raise hell." CSW informed that he would need to call the number on his Medical card. Spouse stepped away to call his son. CSW called [employer] [phone] and spoke with [name], who stated the case is waiting to be reviewed by the medical director and they have up to 72 hours. Spouse returned and CSW asked if spouse is able to pay privately. Spouse states he may be able to pay privately depending on how much and how long he would have to pay. CSW explained that if spouse did pay privately, and insurance was not approved patient would continue to be responsible for payment.   3:53pm- CSW called patient's son ([phone]) on the phone while with patient's spouse. CSW discussed discharge planning for patient in the event insurance is not approved. Son and spouse agreeable that patient will discharge to [facility] and pay privately if insurance not approved. CSW provided a list of SNFs and memory care ALFs/SNFs for long term care planning.    4:17pm- CSW received a call from [name] at [facility] stating patient was approved for 5  days; authorization#: [id number] and patient will have a $10/day copay for the first 20 days. CSW informed spouse, son, RN [name], and Dr. Alfred Lyons. CSW left a Advertising account executive for [employer] at [facility]. CSW called facility back and was informed [name] left for the day. CSW spoke with RN [name], who stated facility unsure if the can get patient's medication for this evening because of the late house. RN [name] asked that patient's discharge information be faxed to [phone] ASAP. CSW sent fax; confirmation received.   5:36pm- CSW received a call from [place] stating fax was received and she needs report. RN [name] stated patient will be going to room# 111. CSW connected RN [name] [phone]) to RN [name]. Family updated. RN [name] and Dr. Alfred Lyons updated. CSW signing off as no further Social Work intervention needed.   [name], [name], [name] Social Worker-ED [phone]

## 2017-06-20 NOTE — ED Notes (Signed)
Pt offered breakfast. Declined at this time. Lights dimmed.

## 2017-06-25 DIAGNOSIS — M1388 Other specified arthritis, other site: Secondary | ICD-10-CM | POA: Diagnosis not present

## 2017-06-25 DIAGNOSIS — R2681 Unsteadiness on feet: Secondary | ICD-10-CM | POA: Diagnosis not present

## 2017-06-25 DIAGNOSIS — I10 Essential (primary) hypertension: Secondary | ICD-10-CM | POA: Diagnosis not present

## 2017-06-25 DIAGNOSIS — S42301A Unspecified fracture of shaft of humerus, right arm, initial encounter for closed fracture: Secondary | ICD-10-CM | POA: Diagnosis not present

## 2017-07-04 DIAGNOSIS — M1388 Other specified arthritis, other site: Secondary | ICD-10-CM | POA: Diagnosis not present

## 2017-07-04 DIAGNOSIS — I10 Essential (primary) hypertension: Secondary | ICD-10-CM | POA: Diagnosis not present

## 2017-07-04 DIAGNOSIS — S42301A Unspecified fracture of shaft of humerus, right arm, initial encounter for closed fracture: Secondary | ICD-10-CM | POA: Diagnosis not present

## 2017-07-04 DIAGNOSIS — I350 Nonrheumatic aortic (valve) stenosis: Secondary | ICD-10-CM | POA: Diagnosis not present

## 2017-07-08 DIAGNOSIS — R296 Repeated falls: Secondary | ICD-10-CM | POA: Diagnosis not present

## 2017-07-08 DIAGNOSIS — F4489 Other dissociative and conversion disorders: Secondary | ICD-10-CM | POA: Diagnosis not present

## 2017-07-08 DIAGNOSIS — E039 Hypothyroidism, unspecified: Secondary | ICD-10-CM | POA: Diagnosis not present

## 2017-07-08 DIAGNOSIS — S42201A Unspecified fracture of upper end of right humerus, initial encounter for closed fracture: Secondary | ICD-10-CM | POA: Diagnosis not present

## 2017-07-09 DIAGNOSIS — K5900 Constipation, unspecified: Secondary | ICD-10-CM | POA: Diagnosis not present

## 2017-07-09 DIAGNOSIS — Z791 Long term (current) use of non-steroidal anti-inflammatories (NSAID): Secondary | ICD-10-CM | POA: Diagnosis not present

## 2017-07-09 DIAGNOSIS — F0390 Unspecified dementia without behavioral disturbance: Secondary | ICD-10-CM | POA: Diagnosis not present

## 2017-07-09 DIAGNOSIS — S42351D Displaced comminuted fracture of shaft of humerus, right arm, subsequent encounter for fracture with routine healing: Secondary | ICD-10-CM | POA: Diagnosis not present

## 2017-07-09 DIAGNOSIS — I10 Essential (primary) hypertension: Secondary | ICD-10-CM | POA: Diagnosis not present

## 2017-07-09 DIAGNOSIS — Z9181 History of falling: Secondary | ICD-10-CM | POA: Diagnosis not present

## 2017-07-09 DIAGNOSIS — Z7982 Long term (current) use of aspirin: Secondary | ICD-10-CM | POA: Diagnosis not present

## 2017-07-09 DIAGNOSIS — R296 Repeated falls: Secondary | ICD-10-CM | POA: Diagnosis not present

## 2017-07-09 DIAGNOSIS — E039 Hypothyroidism, unspecified: Secondary | ICD-10-CM | POA: Diagnosis not present

## 2017-07-09 DIAGNOSIS — I350 Nonrheumatic aortic (valve) stenosis: Secondary | ICD-10-CM | POA: Diagnosis not present

## 2017-07-10 ENCOUNTER — Other Ambulatory Visit: Payer: Self-pay

## 2017-07-10 NOTE — Patient Outreach (Signed)
[facility]) Care Management  4/88/8916  [name] [birth year] [id number]  Transition of care  Referral date: 07/09/17 Referral source: discharged from [place] 07/05/17 Insurance: [employer] advantage.  Telephone call to patient regarding transition referral. HIPAA verified with patient. Explained reason for call. Patient reports she was in the hospital and nursing home because she broke her right arm. Patient states she had therapy when she was in the nursing home. Patient states she has all of her medication and is taking them as prescribed. Patient would not review medications with RNCM stating she does not have her medication list with her, Patient states she thinks she is suppose to have home health.  Unsure of name. Patient states she doesn't have the paperwork with her. Patient states she live with her spouse. Patient refused to have RNCM speak with her spouse. Patient states she is currently at the [facility] and request call back at another time. ASSESSMENT; Patient poor historian. Did not give verbal authorization to speak with anyone. Unwilling to complete transition of care call. r  PLAN:RNCM Will attempt 2nd telephone call to patient within 4 business days.    [name] RN,BSN,CCM [facility] Telephonic  [phone]

## 2017-07-12 ENCOUNTER — Other Ambulatory Visit: Payer: Self-pay

## 2017-07-12 ENCOUNTER — Ambulatory Visit: Payer: Self-pay

## 2017-07-12 NOTE — Patient Outreach (Signed)
[facility] [facility]) Care Management  8/59/0931  [name] [birth year] [id number]   Transition of care  Referral date: 07/09/17 Referral source: discharged from [place] 07/05/17 Insurance: [employer] advantage   Telephone call to patient regarding transition of care follow up.  Unable to reach patient. HIPAA compliant voice message left with call back phone number.  PLAN; RNCM will attempt 2nd telephone call to patient within  4 business days.  RNCm will send outreach letter to attempt contact.   [name] RN,BSN,CCM [facility] Telephonic  [phone]      .

## 2017-07-17 ENCOUNTER — Other Ambulatory Visit: Payer: Self-pay

## 2017-07-17 NOTE — Patient Outreach (Signed)
[place]) Care Management  11/07/8108  MARILENE VATH [birth year] [id number]    Transition of care  Referral date:07/09/17 Referral source:discharged from [place] 07/05/17 Insurance:Health team advantage Attempt #3  Telephone call to patient regarding transition of care follow up.  Unable to reach patient. HIPAA compliant voice message left with call back phone number.  PLAN;  If no return call will proceed with case closure   [name] RN,BSN,CCM [facility] Telephonic  [phone]

## 2017-07-22 DIAGNOSIS — E039 Hypothyroidism, unspecified: Secondary | ICD-10-CM | POA: Diagnosis not present

## 2017-07-22 DIAGNOSIS — R296 Repeated falls: Secondary | ICD-10-CM | POA: Diagnosis not present

## 2017-07-22 DIAGNOSIS — Z9181 History of falling: Secondary | ICD-10-CM | POA: Diagnosis not present

## 2017-07-22 DIAGNOSIS — S42351D Displaced comminuted fracture of shaft of humerus, right arm, subsequent encounter for fracture with routine healing: Secondary | ICD-10-CM | POA: Diagnosis not present

## 2017-07-22 DIAGNOSIS — I350 Nonrheumatic aortic (valve) stenosis: Secondary | ICD-10-CM | POA: Diagnosis not present

## 2017-07-22 DIAGNOSIS — Z791 Long term (current) use of non-steroidal anti-inflammatories (NSAID): Secondary | ICD-10-CM | POA: Diagnosis not present

## 2017-07-22 DIAGNOSIS — K5900 Constipation, unspecified: Secondary | ICD-10-CM | POA: Diagnosis not present

## 2017-07-22 DIAGNOSIS — Z7982 Long term (current) use of aspirin: Secondary | ICD-10-CM | POA: Diagnosis not present

## 2017-07-22 DIAGNOSIS — I10 Essential (primary) hypertension: Secondary | ICD-10-CM | POA: Diagnosis not present

## 2017-07-22 DIAGNOSIS — F0390 Unspecified dementia without behavioral disturbance: Secondary | ICD-10-CM | POA: Diagnosis not present

## 2017-07-23 ENCOUNTER — Other Ambulatory Visit: Payer: Self-pay

## 2017-07-23 DIAGNOSIS — S42201A Unspecified fracture of upper end of right humerus, initial encounter for closed fracture: Secondary | ICD-10-CM | POA: Diagnosis not present

## 2017-07-23 NOTE — Patient Outreach (Signed)
[facility]) Care Management  4/92/0100  Ayanna Gheen Montesinos 10/11/1971 [id number]  No response from patient after telephone calls and outreach letter attempt.   PLAN; RNCM will close patient due to being unable to reach.  RNCm will send closure notification to patients primary MD  [name] RN,BSN,CCM [facility] Telephonic  [phone]

## 2017-07-24 DIAGNOSIS — R269 Unspecified abnormalities of gait and mobility: Secondary | ICD-10-CM | POA: Diagnosis not present

## 2017-07-24 DIAGNOSIS — R4189 Other symptoms and signs involving cognitive functions and awareness: Secondary | ICD-10-CM | POA: Diagnosis not present

## 2017-07-24 DIAGNOSIS — F0280 Dementia in other diseases classified elsewhere without behavioral disturbance: Secondary | ICD-10-CM | POA: Diagnosis not present

## 2017-07-24 DIAGNOSIS — R2689 Other abnormalities of gait and mobility: Secondary | ICD-10-CM | POA: Diagnosis not present

## 2017-07-24 DIAGNOSIS — Z8673 Personal history of transient ischemic attack (TIA), and cerebral infarction without residual deficits: Secondary | ICD-10-CM | POA: Diagnosis not present

## 2017-07-24 DIAGNOSIS — F0150 Vascular dementia without behavioral disturbance: Secondary | ICD-10-CM | POA: Diagnosis not present

## 2017-07-24 DIAGNOSIS — E538 Deficiency of other specified B group vitamins: Secondary | ICD-10-CM | POA: Diagnosis not present

## 2017-07-24 DIAGNOSIS — G309 Alzheimer's disease, unspecified: Secondary | ICD-10-CM | POA: Diagnosis not present

## 2017-08-04 DIAGNOSIS — S42201A Unspecified fracture of upper end of right humerus, initial encounter for closed fracture: Secondary | ICD-10-CM | POA: Diagnosis not present

## 2017-08-04 DIAGNOSIS — M6281 Muscle weakness (generalized): Secondary | ICD-10-CM | POA: Diagnosis not present

## 2017-08-04 DIAGNOSIS — S42201D Unspecified fracture of upper end of right humerus, subsequent encounter for fracture with routine healing: Secondary | ICD-10-CM | POA: Diagnosis not present

## 2017-08-04 DIAGNOSIS — I350 Nonrheumatic aortic (valve) stenosis: Secondary | ICD-10-CM | POA: Diagnosis not present

## 2017-08-13 ENCOUNTER — Other Ambulatory Visit: Payer: Self-pay | Admitting: *Deleted

## 2017-08-13 DIAGNOSIS — F0280 Dementia in other diseases classified elsewhere without behavioral disturbance: Secondary | ICD-10-CM | POA: Diagnosis not present

## 2017-08-13 DIAGNOSIS — R2689 Other abnormalities of gait and mobility: Secondary | ICD-10-CM | POA: Diagnosis not present

## 2017-08-13 DIAGNOSIS — F0150 Vascular dementia without behavioral disturbance: Secondary | ICD-10-CM | POA: Diagnosis not present

## 2017-08-13 DIAGNOSIS — G309 Alzheimer's disease, unspecified: Secondary | ICD-10-CM | POA: Diagnosis not present

## 2017-08-13 DIAGNOSIS — R269 Unspecified abnormalities of gait and mobility: Secondary | ICD-10-CM | POA: Diagnosis not present

## 2017-08-13 DIAGNOSIS — R4189 Other symptoms and signs involving cognitive functions and awareness: Secondary | ICD-10-CM | POA: Diagnosis not present

## 2017-08-13 DIAGNOSIS — E538 Deficiency of other specified B group vitamins: Secondary | ICD-10-CM | POA: Diagnosis not present

## 2017-08-13 NOTE — Patient Outreach (Signed)
[name] was recently sent home from an ER. I received a referral to see if I could assist with pt placement from [facility]. I spoke with [name] this evening. He says they are probably going to move together into an ALF in [place] where his son resides. We chatted a bit and then [name] gave me his son's phone numbers. He is going to tell him I will call him to see if I can provide any needed information or insight to assist them with this process.  I will call [name] tomorrow.  [name]. [name], MSN, [facility] Gerontological Nurse Practitioner [facility] Care Management [phone]

## 2017-08-16 ENCOUNTER — Other Ambulatory Visit: Payer: Self-pay | Admitting: *Deleted

## 2017-08-16 NOTE — Patient Outreach (Signed)
Referral received from [facility] for potential care management, perhaps LTC placement. I spoke with pt's husband on Tuesday and he gave me the phone numbers for his son and directed me to call him. I have called those numbers today and left a message requesting a return call.  [name]. [name], MSN, [facility] Gerontological Nurse Practitioner [facility] Care Management [phone]

## 2017-08-20 ENCOUNTER — Other Ambulatory Visit: Payer: Self-pay | Admitting: *Deleted

## 2017-08-20 ENCOUNTER — Encounter: Payer: Self-pay | Admitting: *Deleted

## 2017-08-20 NOTE — Patient Outreach (Signed)
Care coordination call to pt's son, [name], as referred my [name], pt's husband. I was able to speak with him today and he confirms that his mother and father will be moving into [facility] on [address]. His mother will hopefully move in this weekend to the memory care unit and his father will follow once the family home is organized for him to join his wife. He will be next door in the regular assisted living building. [name] has made all the arrangements, they have toured and put a deposit down. He has requested FL2 forms and both parents have had their Tb tests.  I asked [name] if his parents have a HCPOA and Living Will and he says they may have at one time but no one knows where it is. I advised I can send both of those documents and a MOST for his parents. He does say that neither parent wants to be resusitated. I reinforced that these documents that express their wishes must be notarized and be in their medical record. He would like me to do this. I will also send forms for he and his wife.  There will be no further need for me to follow [name] at this time. I did encourage [name] to keep my number in case he needs to ask for guidance.  [name]. [name], MSN, [facility] Gerontological Nurse Practitioner [facility] Care Management [phone]

## 2017-08-20 NOTE — Patient Outreach (Signed)
Care coordination call to pt's son, [name], as referred my Mr. [name], pt's husband. I was able to speak with him today and he confirms that his mother and father will be moving into [place] on [address]. His mother will hopefully move in this weekend to the memory care unit and his father will follow once the family home is organized for him to join his wife. He will be next door in the regular assisted living building. [name] has made all the arrangements, they have toured and put a deposit down. He has requested FL2 forms and both parents have had their Tb tests.  I asked [name] if his parents have a HCPOA and Living Will and he says they may have at one time but no one knows where it is. I advised I can send both of those documents and a MOST for his parents. He does say that Lyons parent wants to be resusitated. I reinforced that these documents that express their wishes must be notarized and be in their medical record. He would like me to do this. I will also send forms for he and his wife.  There will be no further need for me to follow [name] at this time. I did encourage [name] to keep my number in case he needs to ask for guidance.  [name]. Tara Neither, MSN, [facility] Gerontological Nurse Practitioner [facility] Care Management [phone]

## 2017-09-10 DIAGNOSIS — I10 Essential (primary) hypertension: Secondary | ICD-10-CM | POA: Diagnosis not present

## 2017-09-10 DIAGNOSIS — F0151 Vascular dementia with behavioral disturbance: Secondary | ICD-10-CM | POA: Diagnosis not present

## 2017-09-10 DIAGNOSIS — Z79899 Other long term (current) drug therapy: Secondary | ICD-10-CM | POA: Diagnosis not present

## 2017-09-17 DIAGNOSIS — R451 Restlessness and agitation: Secondary | ICD-10-CM | POA: Diagnosis not present

## 2017-09-17 DIAGNOSIS — Z79899 Other long term (current) drug therapy: Secondary | ICD-10-CM | POA: Diagnosis not present

## 2017-09-17 DIAGNOSIS — M25511 Pain in right shoulder: Secondary | ICD-10-CM | POA: Diagnosis not present

## 2017-09-17 DIAGNOSIS — F0281 Dementia in other diseases classified elsewhere with behavioral disturbance: Secondary | ICD-10-CM | POA: Diagnosis not present

## 2017-09-19 DIAGNOSIS — M25511 Pain in right shoulder: Secondary | ICD-10-CM | POA: Diagnosis not present

## 2017-09-20 DIAGNOSIS — G309 Alzheimer's disease, unspecified: Secondary | ICD-10-CM | POA: Diagnosis not present

## 2017-09-20 DIAGNOSIS — R41840 Attention and concentration deficit: Secondary | ICD-10-CM | POA: Diagnosis not present

## 2017-09-20 DIAGNOSIS — I6930 Unspecified sequelae of cerebral infarction: Secondary | ICD-10-CM | POA: Diagnosis not present

## 2017-09-20 DIAGNOSIS — I10 Essential (primary) hypertension: Secondary | ICD-10-CM | POA: Diagnosis not present

## 2017-09-20 DIAGNOSIS — E7800 Pure hypercholesterolemia, unspecified: Secondary | ICD-10-CM | POA: Diagnosis not present

## 2017-09-20 DIAGNOSIS — F0150 Vascular dementia without behavioral disturbance: Secondary | ICD-10-CM | POA: Diagnosis not present

## 2017-09-20 DIAGNOSIS — F0280 Dementia in other diseases classified elsewhere without behavioral disturbance: Secondary | ICD-10-CM | POA: Diagnosis not present

## 2017-09-20 DIAGNOSIS — M170 Bilateral primary osteoarthritis of knee: Secondary | ICD-10-CM | POA: Diagnosis not present

## 2017-09-20 DIAGNOSIS — R296 Repeated falls: Secondary | ICD-10-CM | POA: Diagnosis not present

## 2017-09-20 DIAGNOSIS — S12101S Unspecified nondisplaced fracture of second cervical vertebra, sequela: Secondary | ICD-10-CM | POA: Diagnosis not present

## 2017-09-20 DIAGNOSIS — F4489 Other dissociative and conversion disorders: Secondary | ICD-10-CM | POA: Diagnosis not present

## 2017-09-20 DIAGNOSIS — E039 Hypothyroidism, unspecified: Secondary | ICD-10-CM | POA: Diagnosis not present

## 2017-09-20 DIAGNOSIS — R2681 Unsteadiness on feet: Secondary | ICD-10-CM | POA: Diagnosis not present

## 2017-09-22 DIAGNOSIS — M25551 Pain in right hip: Secondary | ICD-10-CM | POA: Diagnosis not present

## 2017-09-22 DIAGNOSIS — M25562 Pain in left knee: Secondary | ICD-10-CM | POA: Diagnosis not present

## 2017-09-22 DIAGNOSIS — S0990XA Unspecified injury of head, initial encounter: Secondary | ICD-10-CM | POA: Diagnosis not present

## 2017-09-22 DIAGNOSIS — S199XXA Unspecified injury of neck, initial encounter: Secondary | ICD-10-CM | POA: Diagnosis not present

## 2017-09-22 DIAGNOSIS — M5489 Other dorsalgia: Secondary | ICD-10-CM | POA: Diagnosis not present

## 2017-09-22 DIAGNOSIS — S8991XA Unspecified injury of right lower leg, initial encounter: Secondary | ICD-10-CM | POA: Diagnosis not present

## 2017-09-22 DIAGNOSIS — R52 Pain, unspecified: Secondary | ICD-10-CM | POA: Diagnosis not present

## 2017-09-22 DIAGNOSIS — W19XXXA Unspecified fall, initial encounter: Secondary | ICD-10-CM | POA: Diagnosis not present

## 2017-09-22 DIAGNOSIS — S0993XA Unspecified injury of face, initial encounter: Secondary | ICD-10-CM | POA: Diagnosis not present

## 2017-09-22 DIAGNOSIS — R402441 Other coma, without documented Glasgow coma scale score, or with partial score reported, in the field [EMT or ambulance]: Secondary | ICD-10-CM | POA: Diagnosis not present

## 2017-09-22 DIAGNOSIS — I959 Hypotension, unspecified: Secondary | ICD-10-CM | POA: Diagnosis not present

## 2017-09-23 DIAGNOSIS — Z743 Need for continuous supervision: Secondary | ICD-10-CM | POA: Diagnosis not present

## 2017-09-23 DIAGNOSIS — R279 Unspecified lack of coordination: Secondary | ICD-10-CM | POA: Diagnosis not present

## 2017-09-25 DIAGNOSIS — M25519 Pain in unspecified shoulder: Secondary | ICD-10-CM | POA: Diagnosis not present

## 2017-09-25 DIAGNOSIS — Z743 Need for continuous supervision: Secondary | ICD-10-CM | POA: Diagnosis not present

## 2017-09-25 DIAGNOSIS — Z79899 Other long term (current) drug therapy: Secondary | ICD-10-CM | POA: Diagnosis not present

## 2017-09-25 DIAGNOSIS — M542 Cervicalgia: Secondary | ICD-10-CM | POA: Diagnosis not present

## 2017-09-25 DIAGNOSIS — S199XXA Unspecified injury of neck, initial encounter: Secondary | ICD-10-CM | POA: Diagnosis not present

## 2017-09-25 DIAGNOSIS — R52 Pain, unspecified: Secondary | ICD-10-CM | POA: Diagnosis not present

## 2017-09-25 DIAGNOSIS — S0990XA Unspecified injury of head, initial encounter: Secondary | ICD-10-CM | POA: Diagnosis not present

## 2017-09-25 DIAGNOSIS — W19XXXA Unspecified fall, initial encounter: Secondary | ICD-10-CM | POA: Diagnosis not present

## 2017-09-25 DIAGNOSIS — R350 Frequency of micturition: Secondary | ICD-10-CM | POA: Diagnosis not present

## 2017-09-25 DIAGNOSIS — R51 Headache: Secondary | ICD-10-CM | POA: Diagnosis not present

## 2017-09-25 DIAGNOSIS — R279 Unspecified lack of coordination: Secondary | ICD-10-CM | POA: Diagnosis not present

## 2017-09-30 DIAGNOSIS — R296 Repeated falls: Secondary | ICD-10-CM | POA: Diagnosis not present

## 2017-09-30 DIAGNOSIS — F0280 Dementia in other diseases classified elsewhere without behavioral disturbance: Secondary | ICD-10-CM | POA: Diagnosis not present

## 2017-09-30 DIAGNOSIS — M170 Bilateral primary osteoarthritis of knee: Secondary | ICD-10-CM | POA: Diagnosis not present

## 2017-09-30 DIAGNOSIS — Z7982 Long term (current) use of aspirin: Secondary | ICD-10-CM | POA: Diagnosis not present

## 2017-09-30 DIAGNOSIS — G309 Alzheimer's disease, unspecified: Secondary | ICD-10-CM | POA: Diagnosis not present

## 2017-10-01 DIAGNOSIS — I10 Essential (primary) hypertension: Secondary | ICD-10-CM | POA: Diagnosis not present

## 2017-10-01 DIAGNOSIS — R269 Unspecified abnormalities of gait and mobility: Secondary | ICD-10-CM | POA: Diagnosis not present

## 2017-10-01 DIAGNOSIS — Z79899 Other long term (current) drug therapy: Secondary | ICD-10-CM | POA: Diagnosis not present

## 2017-10-01 DIAGNOSIS — E039 Hypothyroidism, unspecified: Secondary | ICD-10-CM | POA: Diagnosis not present

## 2017-10-08 DIAGNOSIS — F0281 Dementia in other diseases classified elsewhere with behavioral disturbance: Secondary | ICD-10-CM | POA: Diagnosis not present

## 2017-10-08 DIAGNOSIS — Z79899 Other long term (current) drug therapy: Secondary | ICD-10-CM | POA: Diagnosis not present

## 2017-10-08 DIAGNOSIS — R269 Unspecified abnormalities of gait and mobility: Secondary | ICD-10-CM | POA: Diagnosis not present

## 2017-10-11 DIAGNOSIS — Z79899 Other long term (current) drug therapy: Secondary | ICD-10-CM | POA: Diagnosis not present

## 2017-10-29 DIAGNOSIS — H16223 Keratoconjunctivitis sicca, not specified as Sjogren's, bilateral: Secondary | ICD-10-CM | POA: Diagnosis not present

## 2017-10-29 DIAGNOSIS — Z961 Presence of intraocular lens: Secondary | ICD-10-CM | POA: Diagnosis not present

## 2017-10-29 DIAGNOSIS — H524 Presbyopia: Secondary | ICD-10-CM | POA: Diagnosis not present

## 2017-10-29 DIAGNOSIS — H02831 Dermatochalasis of right upper eyelid: Secondary | ICD-10-CM | POA: Diagnosis not present

## 2017-10-29 DIAGNOSIS — H02834 Dermatochalasis of left upper eyelid: Secondary | ICD-10-CM | POA: Diagnosis not present

## 2017-10-29 DIAGNOSIS — H1859 Other hereditary corneal dystrophies: Secondary | ICD-10-CM | POA: Diagnosis not present

## 2017-10-29 DIAGNOSIS — H43393 Other vitreous opacities, bilateral: Secondary | ICD-10-CM | POA: Diagnosis not present

## 2017-10-29 DIAGNOSIS — H179 Unspecified corneal scar and opacity: Secondary | ICD-10-CM | POA: Diagnosis not present

## 2017-10-29 DIAGNOSIS — H52203 Unspecified astigmatism, bilateral: Secondary | ICD-10-CM | POA: Diagnosis not present

## 2017-11-07 ENCOUNTER — Emergency Department (HOSPITAL_BASED_OUTPATIENT_CLINIC_OR_DEPARTMENT_OTHER)
Admission: EM | Admit: 2017-11-07 | Discharge: 2017-11-07 | Disposition: A | Discharge status: Home / Self Care | Payer: PPO | Attending: Emergency Medicine | Admitting: Emergency Medicine

## 2017-11-07 ENCOUNTER — Encounter (HOSPITAL_BASED_OUTPATIENT_CLINIC_OR_DEPARTMENT_OTHER): Payer: Self-pay | Admitting: Emergency Medicine

## 2017-11-07 ENCOUNTER — Emergency Department (HOSPITAL_BASED_OUTPATIENT_CLINIC_OR_DEPARTMENT_OTHER): Payer: PPO

## 2017-11-07 ENCOUNTER — Other Ambulatory Visit: Payer: Self-pay

## 2017-11-07 DIAGNOSIS — S0990XA Unspecified injury of head, initial encounter: Secondary | ICD-10-CM | POA: Diagnosis not present

## 2017-11-07 DIAGNOSIS — M542 Cervicalgia: Secondary | ICD-10-CM | POA: Diagnosis not present

## 2017-11-07 DIAGNOSIS — E039 Hypothyroidism, unspecified: Secondary | ICD-10-CM | POA: Insufficient documentation

## 2017-11-07 DIAGNOSIS — W19XXXA Unspecified fall, initial encounter: Secondary | ICD-10-CM | POA: Diagnosis not present

## 2017-11-07 DIAGNOSIS — Z87891 Personal history of nicotine dependence: Secondary | ICD-10-CM | POA: Diagnosis not present

## 2017-11-07 DIAGNOSIS — Y9389 Activity, other specified: Secondary | ICD-10-CM | POA: Diagnosis not present

## 2017-11-07 DIAGNOSIS — R51 Headache: Secondary | ICD-10-CM | POA: Diagnosis not present

## 2017-11-07 DIAGNOSIS — Z79899 Other long term (current) drug therapy: Secondary | ICD-10-CM | POA: Diagnosis not present

## 2017-11-07 DIAGNOSIS — I10 Essential (primary) hypertension: Secondary | ICD-10-CM | POA: Diagnosis not present

## 2017-11-07 DIAGNOSIS — Y998 Other external cause status: Secondary | ICD-10-CM | POA: Insufficient documentation

## 2017-11-07 DIAGNOSIS — Z7982 Long term (current) use of aspirin: Secondary | ICD-10-CM | POA: Diagnosis not present

## 2017-11-07 DIAGNOSIS — F0280 Dementia in other diseases classified elsewhere without behavioral disturbance: Secondary | ICD-10-CM | POA: Insufficient documentation

## 2017-11-07 DIAGNOSIS — Y92129 Unspecified place in nursing home as the place of occurrence of the external cause: Secondary | ICD-10-CM | POA: Insufficient documentation

## 2017-11-07 DIAGNOSIS — Z743 Need for continuous supervision: Secondary | ICD-10-CM | POA: Diagnosis not present

## 2017-11-07 DIAGNOSIS — W0110XA Fall on same level from slipping, tripping and stumbling with subsequent striking against unspecified object, initial encounter: Secondary | ICD-10-CM | POA: Insufficient documentation

## 2017-11-07 DIAGNOSIS — R404 Transient alteration of awareness: Secondary | ICD-10-CM | POA: Diagnosis not present

## 2017-11-07 DIAGNOSIS — G309 Alzheimer's disease, unspecified: Secondary | ICD-10-CM | POA: Diagnosis not present

## 2017-11-07 DIAGNOSIS — R279 Unspecified lack of coordination: Secondary | ICD-10-CM | POA: Diagnosis not present

## 2017-11-07 DIAGNOSIS — Z952 Presence of prosthetic heart valve: Secondary | ICD-10-CM | POA: Insufficient documentation

## 2017-11-07 DIAGNOSIS — R52 Pain, unspecified: Secondary | ICD-10-CM | POA: Diagnosis not present

## 2017-11-07 DIAGNOSIS — S199XXA Unspecified injury of neck, initial encounter: Secondary | ICD-10-CM | POA: Diagnosis not present

## 2017-11-07 HISTORY — DX: Alzheimer's disease, unspecified: G30.9

## 2017-11-07 HISTORY — DX: Dementia in other diseases classified elsewhere, unspecified severity, without behavioral disturbance, psychotic disturbance, mood disturbance, and anxiety: F02.80

## 2017-11-07 NOTE — ED Notes (Signed)
Patient transported to CT 

## 2017-11-07 NOTE — ED Notes (Signed)
[facility]AR here for transport. 

## 2017-11-07 NOTE — ED Provider Notes (Signed)
[facility] EMERGENCY DEPARTMENT Provider Note   CSN: [id number] Arrival date & time: 11/07/17  0417     History   Chief Complaint Chief Complaint  Patient presents with  . Fall   Level 5 caveat: Dementia  HPI [name] is a 84 y.o. female.  HPI Patient is a 84 year old female presents the emergency department after an unwitnessed fall at the memory care unit.  She reports mild pain in her posterior head.  No vomiting reported.  No change in mental status.  History of dementia.  Per the [facility] the patient is not on anticoagulants.  Denies pain in her arms or legs.  Moves all 4 extremities.  Patient does not remember the events of the evening   Past Medical History:  Diagnosis Date  . Alzheimer disease   . Arthritis   . Dementia   . Dyslipidemia   . HTN (hypertension)   . Hypothyroidism   . Severe aortic stenosis     Patient Active Problem List   Diagnosis Date Noted  . Severe aortic stenosis   . HTN (hypertension)   . Dyslipidemia   . Hypothyroidism   . CONSTIPATION 06/20/2009    Past Surgical History:  Procedure Laterality Date  . AORTIC VALVE REPLACEMENT  1999  . CYST EXCISION  20 years ago   both breast had cyst removed  . TOTAL ABDOMINAL HYSTERECTOMY     Uterine cancer     OB History   None      Home Medications    Prior to Admission medications   Medication Sig Start Date End Date Taking? Authorizing Provider  aspirin EC 81 MG tablet Take 81 mg by mouth daily.   Yes [provider]  atorvastatin (LIPITOR) 80 MG tablet Take 1 tablet by mouth at bedtime. 06/17/17  Yes [provider]  Calcium Carbonate-Vitamin D (CALCIUM 600+D) 600-400 MG-UNIT tablet Take 1 tablet by mouth 2 (two) times daily.   Yes [provider]  levothyroxine (SYNTHROID, LEVOTHROID) 100 MCG tablet Take 100 mcg by mouth daily before breakfast.   Yes [provider]    Family History Family History  Problem Relation Age of Onset   . Heart disease Father   . Heart disease Mother     Social History Social History   Tobacco Use  . Smoking status: Former Smoker    Types: Cigarettes    Last attempt to quit: 06/20/1995    Years since quitting: 22.4  . Smokeless tobacco: Never Used  Substance Use Topics  . Alcohol use: Yes    Comment: Occ  . Drug use: No     Allergies   Patient has no known allergies.   Review of Systems Review of Systems  Unable to perform ROS: Dementia     Physical Exam Updated Vital Signs BP (!) 129/49 (BP Location: Right Arm)   Pulse 77   Temp 98.4 F (36.9 C) (Oral)   Resp 16   SpO2 97%   Physical Exam  Constitutional: She appears well-developed and well-nourished. No distress.  HENT:  Head: Normocephalic and atraumatic.  Eyes: Pupils are equal, round, and reactive to light. EOM are normal.  Neck: Normal range of motion. Neck supple.  No obvious cervical point tenderness.  No cervical step-offs  Cardiovascular: Normal rate, regular rhythm and normal heart sounds.  Pulmonary/Chest: Effort normal and breath sounds normal.  Abdominal: Soft. She exhibits no distension. There is no tenderness.  Musculoskeletal: Normal range of motion.  Full  range of motion bilateral hips, knees, ankles.  Full range of motion bilateral shoulders, elbows, wrist.  Neurological: She is alert.  5/5 strength in major muscle groups of  bilateral upper and lower extremities. Speech normal. No facial asymetry.   Skin: Skin is warm and dry.  Psychiatric: She has a normal mood and affect. Judgment normal.  Nursing note and vitals reviewed.    ED Treatments / Results  Labs (all labs ordered are listed, but only abnormal results are displayed) Labs Reviewed - No data to display  EKG None  Radiology Ct Head Wo Contrast  Result Date: 11/07/2017 CLINICAL DATA:  Unwitnessed fall with head and neck pain. EXAM: CT HEAD WITHOUT CONTRAST CT CERVICAL SPINE WITHOUT CONTRAST TECHNIQUE: Multidetector CT  imaging of the head and cervical spine was performed following the standard protocol without intravenous contrast. Multiplanar CT image reconstructions of the cervical spine were also generated. COMPARISON:  Head CT 06/19/2017 FINDINGS: CT HEAD FINDINGS Brain: There is no mass, hemorrhage or extra-axial collection. There is generalized atrophy without lobar predilection. 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 There is hypoattenuation of the periventricular white matter, most commonly indicating chronic ischemic microangiopathy. Vascular: Atherosclerotic calcification of the internal carotid arteries at the skull base. No abnormal hyperdensity of the major intracranial arteries or dural venous sinuses. Skull: The visualized skull base, calvarium and extracranial soft tissues are normal. Sinuses/Orbits: No fluid levels or advanced mucosal thickening of the visualized paranasal sinuses. No mastoid or middle ear effusion. The orbits are normal. CT CERVICAL SPINE FINDINGS Alignment: Alignment is normal aside from the below described C1-2 abnormalities. Skull base and vertebrae: Redemonstration of type 2 fracture of the dens with slightly increased posterior angulation of the superior fragment relative to the base. Since the prior study, there has been a fracture of the anteriorly projecting osteophyte arising from the anterior arch of C1. This is age indeterminate. No associated soft tissue swelling. Soft tissues and spinal canal: No prevertebral fluid or swelling. No visible canal hematoma. Disc levels: Bulky anterior osteophytes at C4-7. Flowing anterior osteophytes at C7-T2. Upper chest: No pneumothorax, pulmonary nodule or pleural effusion. Other: Normal visualized paraspinal cervical soft tissues. IMPRESSION: 1. No acute intracranial abnormality. 2. Slightly increased angulation chronic fracture of the dens. Otherwise unchanged cervical spinal alignment. 3. Fracture through and anterior osteophyte arising from the anterior  C1 arch is new since the prior study, but otherwise age-indeterminate. MRI might be helpful for better temporal characterization. 4. Chronic ischemic microangiopathy and generalized atrophy. Electronically Signed   By: Ulyses Jarred M.D.   On: 11/07/2017 05:32   Ct Cervical Spine Wo Contrast  Result Date: 11/07/2017 CLINICAL DATA:  Unwitnessed fall with head and neck pain. EXAM: CT HEAD WITHOUT CONTRAST CT CERVICAL SPINE WITHOUT CONTRAST TECHNIQUE: Multidetector CT imaging of the head and cervical spine was performed following the standard protocol without intravenous contrast. Multiplanar CT image reconstructions of the cervical spine were also generated. COMPARISON:  Head CT 06/19/2017 FINDINGS: CT HEAD FINDINGS Brain: There is no mass, hemorrhage or extra-axial collection. There is generalized atrophy without lobar predilection. 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 There is hypoattenuation of the periventricular white matter, most commonly indicating chronic ischemic microangiopathy. Vascular: Atherosclerotic calcification of the internal carotid arteries at the skull base. No abnormal hyperdensity of the major intracranial arteries or dural venous sinuses. Skull: The visualized skull base, calvarium and extracranial soft tissues are normal. Sinuses/Orbits: No fluid levels or advanced mucosal thickening of the visualized paranasal sinuses. No mastoid or middle  ear effusion. The orbits are normal. CT CERVICAL SPINE FINDINGS Alignment: Alignment is normal aside from the below described C1-2 abnormalities. Skull base and vertebrae: Redemonstration of type 2 fracture of the dens with slightly increased posterior angulation of the superior fragment relative to the base. Since the prior study, there has been a fracture of the anteriorly projecting osteophyte arising from the anterior arch of C1. This is age indeterminate. No associated soft tissue swelling. Soft tissues and spinal canal: No prevertebral fluid or  swelling. No visible canal hematoma. Disc levels: Bulky anterior osteophytes at C4-7. Flowing anterior osteophytes at C7-T2. Upper chest: No pneumothorax, pulmonary nodule or pleural effusion. Other: Normal visualized paraspinal cervical soft tissues. IMPRESSION: 1. No acute intracranial abnormality. 2. Slightly increased angulation chronic fracture of the dens. Otherwise unchanged cervical spinal alignment. 3. Fracture through and anterior osteophyte arising from the anterior C1 arch is new since the prior study, but otherwise age-indeterminate. MRI might be helpful for better temporal characterization. 4. Chronic ischemic microangiopathy and generalized atrophy. Electronically Signed   By: Ulyses Jarred M.D.   On: 11/07/2017 05:32    Procedures Procedures (including critical care time)  Medications Ordered in ED Medications - No data to display   Initial Impression / Assessment and Plan / ED Course  I have reviewed the triage vital signs and the nursing notes.  Pertinent labs & imaging results that were available during my care of the patient were reviewed by me and considered in my medical decision making (see chart for details).     Full range of motion of neck.  No weakness in arms or legs.  No point tenderness along the cervical spine.  The age-indeterminate fractures likely old.  CT head otherwise without acute traumatic findings.  Full range of motion of major joints.  Discharged home in good condition.  Final Clinical Impressions(s) / ED Diagnoses   Final diagnoses:  Injury of head, initial encounter    ED Discharge Orders    None       Jola Schmidt, MD 11/07/17 [phone]

## 2017-11-07 NOTE — ED Triage Notes (Signed)
Pt found beside bed by nsg staff. Unwitnessed fall. Pt c/o head pain.

## 2017-11-07 NOTE — ED Notes (Signed)
[facility] called for transport to nsg facility.

## 2017-11-07 NOTE — ED Triage Notes (Signed)
Pt c/o of pain to back of head. UTR.

## 2017-11-16 ENCOUNTER — Emergency Department (HOSPITAL_BASED_OUTPATIENT_CLINIC_OR_DEPARTMENT_OTHER)
Admission: EM | Admit: 2017-11-16 | Discharge: 2017-11-16 | Disposition: A | Discharge status: Home / Self Care | Payer: PPO | Attending: Emergency Medicine | Admitting: Emergency Medicine

## 2017-11-16 ENCOUNTER — Emergency Department (HOSPITAL_BASED_OUTPATIENT_CLINIC_OR_DEPARTMENT_OTHER): Payer: PPO

## 2017-11-16 ENCOUNTER — Encounter (HOSPITAL_BASED_OUTPATIENT_CLINIC_OR_DEPARTMENT_OTHER): Payer: Self-pay | Admitting: Adult Health

## 2017-11-16 ENCOUNTER — Other Ambulatory Visit: Payer: Self-pay

## 2017-11-16 DIAGNOSIS — Z743 Need for continuous supervision: Secondary | ICD-10-CM | POA: Diagnosis not present

## 2017-11-16 DIAGNOSIS — R51 Headache: Secondary | ICD-10-CM | POA: Diagnosis not present

## 2017-11-16 DIAGNOSIS — Y929 Unspecified place or not applicable: Secondary | ICD-10-CM | POA: Diagnosis not present

## 2017-11-16 DIAGNOSIS — W0110XA Fall on same level from slipping, tripping and stumbling with subsequent striking against unspecified object, initial encounter: Secondary | ICD-10-CM | POA: Insufficient documentation

## 2017-11-16 DIAGNOSIS — R404 Transient alteration of awareness: Secondary | ICD-10-CM | POA: Diagnosis not present

## 2017-11-16 DIAGNOSIS — S79912A Unspecified injury of left hip, initial encounter: Secondary | ICD-10-CM | POA: Diagnosis not present

## 2017-11-16 DIAGNOSIS — G309 Alzheimer's disease, unspecified: Secondary | ICD-10-CM | POA: Diagnosis not present

## 2017-11-16 DIAGNOSIS — Y939 Activity, unspecified: Secondary | ICD-10-CM | POA: Diagnosis not present

## 2017-11-16 DIAGNOSIS — I959 Hypotension, unspecified: Secondary | ICD-10-CM | POA: Diagnosis not present

## 2017-11-16 DIAGNOSIS — M25552 Pain in left hip: Secondary | ICD-10-CM | POA: Diagnosis not present

## 2017-11-16 DIAGNOSIS — I10 Essential (primary) hypertension: Secondary | ICD-10-CM | POA: Insufficient documentation

## 2017-11-16 DIAGNOSIS — M25551 Pain in right hip: Secondary | ICD-10-CM | POA: Diagnosis not present

## 2017-11-16 DIAGNOSIS — E039 Hypothyroidism, unspecified: Secondary | ICD-10-CM | POA: Insufficient documentation

## 2017-11-16 DIAGNOSIS — S199XXA Unspecified injury of neck, initial encounter: Secondary | ICD-10-CM | POA: Diagnosis not present

## 2017-11-16 DIAGNOSIS — Y998 Other external cause status: Secondary | ICD-10-CM | POA: Insufficient documentation

## 2017-11-16 DIAGNOSIS — S0990XA Unspecified injury of head, initial encounter: Secondary | ICD-10-CM | POA: Diagnosis not present

## 2017-11-16 DIAGNOSIS — R279 Unspecified lack of coordination: Secondary | ICD-10-CM | POA: Diagnosis not present

## 2017-11-16 DIAGNOSIS — S79911A Unspecified injury of right hip, initial encounter: Secondary | ICD-10-CM | POA: Diagnosis not present

## 2017-11-16 DIAGNOSIS — R410 Disorientation, unspecified: Secondary | ICD-10-CM | POA: Diagnosis not present

## 2017-11-16 DIAGNOSIS — W19XXXA Unspecified fall, initial encounter: Secondary | ICD-10-CM

## 2017-11-16 DIAGNOSIS — S299XXA Unspecified injury of thorax, initial encounter: Secondary | ICD-10-CM | POA: Diagnosis not present

## 2017-11-16 MED ORDER — ACETAMINOPHEN 325 MG PO TABS
650.0000 mg | ORAL_TABLET | Freq: Once | ORAL | Status: DC
  Filled 2017-11-16: qty 2

## 2017-11-16 NOTE — ED Notes (Signed)
No answer at [facility] with attempted call.

## 2017-11-16 NOTE — ED Notes (Signed)
Pt off the floor in radiology at this time.

## 2017-11-16 NOTE — Discharge Instructions (Signed)
If you notice decreased range of motion or increased pain in a certain joint please follow-up in the emergency room.  If there is increased confusion please follow-up in the emergency room.  Follow-up with your primary care doctor within 1 week of discharge

## 2017-11-16 NOTE — ED Triage Notes (Signed)
Per EMS, pt fell backwards from standing and hit the back of her head. No LOC. PT has a chronic r shoulder injury. She has dementia. She is alert to self. [facility] nursing facility she is at her baseline.

## 2017-11-16 NOTE — ED Provider Notes (Signed)
[facility] EMERGENCY DEPARTMENT Provider Note   CSN: [id number] Arrival date & time: 11/16/17  1501     History   Chief Complaint Chief Complaint  Patient presents with  . Fall    HPI [name] is a 84 y.o. female.  Patient is a 84 year old female with baseline dementia presenting after fall in her nursing facility.  Per EMS staff patient fell and hit the back of her head.  Patient does have a chronic shoulder injury.  Patient was standing and fell backwards.  Only witness was her husband, who is currently not present.  Patient is alone in her room.  Patient unable to provide history.  Patient is oriented to self and knows the president but is otherwise not oriented to place or time.  Patient does state that she has a headache but is unable to further give details of headache.  Patient does report back pain as well.     Past Medical History:  Diagnosis Date  . Alzheimer disease   . Arthritis   . Dementia   . Dyslipidemia   . HTN (hypertension)   . Hypothyroidism   . Severe aortic stenosis     Patient Active Problem List   Diagnosis Date Noted  . Severe aortic stenosis   . HTN (hypertension)   . Dyslipidemia   . Hypothyroidism   . CONSTIPATION 06/20/2009    Past Surgical History:  Procedure Laterality Date  . AORTIC VALVE REPLACEMENT  1999  . CYST EXCISION  20 years ago   both breast had cyst removed  . TOTAL ABDOMINAL HYSTERECTOMY     Uterine cancer     OB History   None      Home Medications    Prior to Admission medications   Medication Sig Start Date End Date Taking? Authorizing Provider  ALPRAZolam Duanne Moron) 0.25 MG tablet Take 0.25 mg by mouth at bedtime as needed for anxiety.   Yes [provider]  aspirin EC 81 MG tablet Take 81 mg by mouth daily.    [provider]  atorvastatin (LIPITOR) 80 MG tablet Take 1 tablet by mouth at bedtime. 06/17/17   [provider]  Calcium Carbonate-Vitamin D (CALCIUM  600+D) 600-400 MG-UNIT tablet Take 1 tablet by mouth 2 (two) times daily.    [provider]  levothyroxine (SYNTHROID, LEVOTHROID) 100 MCG tablet Take 100 mcg by mouth daily before breakfast.    [provider]    Family History Family History  Problem Relation Age of Onset  . Heart disease Father   . Heart disease Mother     Social History Social History   Tobacco Use  . Smoking status: Former Smoker    Types: Cigarettes    Last attempt to quit: 06/20/1995    Years since quitting: 22.4  . Smokeless tobacco: Never Used  Substance Use Topics  . Alcohol use: Yes    Comment: Occ  . Drug use: No     Allergies   Patient has no known allergies.   Review of Systems Review of Systems  Unable to perform ROS: Dementia     Physical Exam Updated Vital Signs BP (!) 126/54   Pulse 75   Temp 98.4 F (36.9 C)   Resp 18   SpO2 99%   Physical Exam  Constitutional: No distress.  HENT:  Head: Normocephalic.  Eyes: Pupils are equal, round, and reactive to light. Conjunctivae and EOM are normal.  Neck: Normal range of motion. Neck  supple.  Cardiovascular: Normal rate and regular rhythm. Exam reveals no gallop and no friction rub.  Murmur heard. Grade 2 out of 6 systolic murmur  Pulmonary/Chest: Effort normal and breath sounds normal. No respiratory distress. She has no wheezes. She has no rales.  Abdominal: Soft. Bowel sounds are normal. She exhibits no mass. There is no tenderness.  Musculoskeletal: Normal range of motion. She exhibits tenderness. She exhibits no edema.  Tenderness to palpation of right hip.  Lymphadenopathy:    She has no cervical adenopathy.  Neurological:  Difficult to test neurological function as patient has baseline dementia and unable to follow some commands Oriented to person, knows the president but otherwise is not oriented to place or time  Skin: Skin is warm. No rash noted.  Psychiatric: She has a normal mood and affect.      ED Treatments / Results  Labs (all labs ordered are listed, but only abnormal results are displayed) Labs Reviewed - No data to display  EKG None  Radiology Dg Chest 2 View  Result Date: 11/16/2017 CLINICAL DATA:  Fall. EXAM: CHEST - 2 VIEW COMPARISON:  07/21/2006 chest radiograph FINDINGS: Cardiomegaly and valve replacement again noted. Mild interstitial prominence is unchanged. There is no evidence of focal airspace disease, pulmonary edema, suspicious pulmonary nodule/mass, pleural effusion, or pneumothorax. No acute bony abnormalities are identified. A remote proximal RIGHT humeral fracture is noted. IMPRESSION: Cardiomegaly without acute abnormality. Electronically Signed   By: Margarette Canada M.D.   On: 11/16/2017 16:22   Ct Head Wo Contrast  Result Date: 11/16/2017 CLINICAL DATA:  Fall backwards from standing injuring back of head. Dementia. EXAM: CT HEAD WITHOUT CONTRAST CT CERVICAL SPINE WITHOUT CONTRAST TECHNIQUE: Multidetector CT imaging of the head and cervical spine was performed following the standard protocol without intravenous contrast. Multiplanar CT image reconstructions of the cervical spine were also generated. COMPARISON:  11/07/2017 as well as 06/14/2016 as well as 02/09/2016 FINDINGS: CT HEAD FINDINGS Brain: Examination demonstrates mild age related atrophic change. There is chronic ischemic microvascular disease. There is no mass, mass effect, shift of midline structures or acute hemorrhage. No evidence of acute infarction. Vascular: No hyperdense vessel or unexpected calcification. Skull: Normal. Negative for fracture or focal lesion. Sinuses/Orbits: No acute finding. Other: None. CT CERVICAL SPINE FINDINGS Alignment: Subtle stable stairstep anterior subluxations compatible degenerative process and not posttraumatic. Skull base and vertebrae: Stable old base of dens fracture. Stable fragment anterior to the C2 fracture. Remaining vertebral body heights are normal.  Moderate spondylosis throughout the cervical spine. Uncovertebral joint spurring and facet arthropathy is present. Bilateral neural foraminal narrowing at multiple levels due to adjacent bony spurring. No acute fracture. Soft tissues and spinal canal: No prevertebral fluid or swelling. No visible canal hematoma. Disc levels:  Within normal. Upper chest: No acute findings. Other: None. IMPRESSION: No acute brain injury. Age related atrophy and chronic ischemic microvascular disease. No acute cervical spine injury. Old displaced base of dens fracture with stable fragment anterior to the fracture site. Moderate spondylosis throughout the cervical spine with multilevel disc disease and multilevel bilateral neural foraminal narrowing. Electronically Signed   By: Marin Olp M.D.   On: 11/16/2017 16:13   Ct Cervical Spine Wo Contrast  Result Date: 11/16/2017 CLINICAL DATA:  Fall backwards from standing injuring back of head. Dementia. EXAM: CT HEAD WITHOUT CONTRAST CT CERVICAL SPINE WITHOUT CONTRAST TECHNIQUE: Multidetector CT imaging of the head and cervical spine was performed following the standard protocol without intravenous contrast. Multiplanar CT  image reconstructions of the cervical spine were also generated. COMPARISON:  11/07/2017 as well as 06/14/2016 as well as 02/09/2016 FINDINGS: CT HEAD FINDINGS Brain: Examination demonstrates mild age related atrophic change. There is chronic ischemic microvascular disease. There is no mass, mass effect, shift of midline structures or acute hemorrhage. No evidence of acute infarction. Vascular: No hyperdense vessel or unexpected calcification. Skull: Normal. Negative for fracture or focal lesion. Sinuses/Orbits: No acute finding. Other: None. CT CERVICAL SPINE FINDINGS Alignment: Subtle stable stairstep anterior subluxations compatible degenerative process and not posttraumatic. Skull base and vertebrae: Stable old base of dens fracture. Stable fragment anterior to  the C2 fracture. Remaining vertebral body heights are normal. Moderate spondylosis throughout the cervical spine. Uncovertebral joint spurring and facet arthropathy is present. Bilateral neural foraminal narrowing at multiple levels due to adjacent bony spurring. No acute fracture. Soft tissues and spinal canal: No prevertebral fluid or swelling. No visible canal hematoma. Disc levels:  Within normal. Upper chest: No acute findings. Other: None. IMPRESSION: No acute brain injury. Age related atrophy and chronic ischemic microvascular disease. No acute cervical spine injury. Old displaced base of dens fracture with stable fragment anterior to the fracture site. Moderate spondylosis throughout the cervical spine with multilevel disc disease and multilevel bilateral neural foraminal narrowing. Electronically Signed   By: Marin Olp M.D.   On: 11/16/2017 16:13   Dg Hips Bilat With Pelvis 3-4 Views  Result Date: 11/16/2017 CLINICAL DATA:  Fall.  Dementia.  Bilateral hip pain. EXAM: DG HIP (WITH OR WITHOUT PELVIS) 3-4V BILAT COMPARISON:  None. FINDINGS: Surgical clips projecting over the lower right abdomen and upper pelvis. Vascular calcifications. Femoral heads are located. No acute fracture. IMPRESSION: No acute osseous abnormality. Electronically Signed   By: Abigail Miyamoto M.D.   On: 11/16/2017 16:19    Procedures Procedures (including critical care time)  Medications Ordered in ED Medications  acetaminophen (TYLENOL) tablet 650 mg (has no administration in time range)     Initial Impression / Assessment and Plan / ED Course  I have reviewed the triage vital signs and the nursing notes.  Pertinent labs & imaging results that were available during my care of the patient were reviewed by me and considered in my medical decision making (see chart for details).     Patient is a 84 year old female presenting after fall in her nursing facility.  Difficult to obtain review of systems or history as  patient has baseline dementia.  Will obtain CT head to rule out cranial injury as patient was reported to have hit her head and does report some headaches.  Unable to assess neuro exam as patient cannot follow some commands.  Patient is complaining of right hip pain as well so will obtain imaging of right hip.  While in radiology suite obtaining images patient complains of left hip pain to radiology tech, and radiology tech states concern on pelvic x-ray of left side so will obtain left hip x-ray as well.  We will also obtain CT cervical spine to rule out any injury.  Will give Tylenol for pain relief at this time.  4:50PM: X-ray images as well as CTs are negative at this time.  Given the patient is negative imaging we will plan to discharge home with strict return precautions.  Will please call to patient's nursing facility to inform them of discharge.  Discussed plan with Dr. Darl Householder who independently examined patient and agrees with plan.  Final Clinical Impressions(s) / ED Diagnoses   Final  diagnoses:  Fall, initial encounter    ED Discharge Orders    None       Caroline More, DO 11/16/17 1657    Drenda Freeze, MD 11/16/17 [phone]

## 2017-11-16 NOTE — ED Notes (Signed)
[facility] called via [facility] @ [phone]

## 2017-11-27 DIAGNOSIS — F0281 Dementia in other diseases classified elsewhere with behavioral disturbance: Secondary | ICD-10-CM | POA: Diagnosis not present

## 2017-11-27 DIAGNOSIS — S50312S Abrasion of left elbow, sequela: Secondary | ICD-10-CM | POA: Diagnosis not present

## 2017-11-27 DIAGNOSIS — Z79899 Other long term (current) drug therapy: Secondary | ICD-10-CM | POA: Diagnosis not present

## 2017-11-28 DIAGNOSIS — Z79899 Other long term (current) drug therapy: Secondary | ICD-10-CM | POA: Diagnosis not present

## 2017-12-10 DIAGNOSIS — Z79899 Other long term (current) drug therapy: Secondary | ICD-10-CM | POA: Diagnosis not present

## 2017-12-10 DIAGNOSIS — E039 Hypothyroidism, unspecified: Secondary | ICD-10-CM | POA: Diagnosis not present

## 2017-12-10 DIAGNOSIS — S51011A Laceration without foreign body of right elbow, initial encounter: Secondary | ICD-10-CM | POA: Diagnosis not present

## 2017-12-10 DIAGNOSIS — I10 Essential (primary) hypertension: Secondary | ICD-10-CM | POA: Diagnosis not present

## 2017-12-10 DIAGNOSIS — F0281 Dementia in other diseases classified elsewhere with behavioral disturbance: Secondary | ICD-10-CM | POA: Diagnosis not present

## 2017-12-13 DIAGNOSIS — M542 Cervicalgia: Secondary | ICD-10-CM | POA: Diagnosis not present

## 2017-12-13 DIAGNOSIS — I499 Cardiac arrhythmia, unspecified: Secondary | ICD-10-CM | POA: Diagnosis not present

## 2017-12-13 DIAGNOSIS — R748 Abnormal levels of other serum enzymes: Secondary | ICD-10-CM | POA: Diagnosis not present

## 2017-12-13 DIAGNOSIS — Z515 Encounter for palliative care: Secondary | ICD-10-CM | POA: Diagnosis not present

## 2017-12-13 DIAGNOSIS — Z7982 Long term (current) use of aspirin: Secondary | ICD-10-CM | POA: Diagnosis not present

## 2017-12-13 DIAGNOSIS — F0281 Dementia in other diseases classified elsewhere with behavioral disturbance: Secondary | ICD-10-CM | POA: Diagnosis not present

## 2017-12-13 DIAGNOSIS — S4992XA Unspecified injury of left shoulder and upper arm, initial encounter: Secondary | ICD-10-CM | POA: Diagnosis not present

## 2017-12-13 DIAGNOSIS — M25511 Pain in right shoulder: Secondary | ICD-10-CM | POA: Diagnosis not present

## 2017-12-13 DIAGNOSIS — E7800 Pure hypercholesterolemia, unspecified: Secondary | ICD-10-CM | POA: Diagnosis not present

## 2017-12-13 DIAGNOSIS — I129 Hypertensive chronic kidney disease with stage 1 through stage 4 chronic kidney disease, or unspecified chronic kidney disease: Secondary | ICD-10-CM | POA: Diagnosis not present

## 2017-12-13 DIAGNOSIS — K5790 Diverticulosis of intestine, part unspecified, without perforation or abscess without bleeding: Secondary | ICD-10-CM | POA: Diagnosis not present

## 2017-12-13 DIAGNOSIS — R52 Pain, unspecified: Secondary | ICD-10-CM | POA: Diagnosis not present

## 2017-12-13 DIAGNOSIS — S3993XA Unspecified injury of pelvis, initial encounter: Secondary | ICD-10-CM | POA: Diagnosis not present

## 2017-12-13 DIAGNOSIS — D649 Anemia, unspecified: Secondary | ICD-10-CM | POA: Diagnosis not present

## 2017-12-13 DIAGNOSIS — S0990XA Unspecified injury of head, initial encounter: Secondary | ICD-10-CM | POA: Diagnosis not present

## 2017-12-13 DIAGNOSIS — E538 Deficiency of other specified B group vitamins: Secondary | ICD-10-CM | POA: Diagnosis not present

## 2017-12-13 DIAGNOSIS — R410 Disorientation, unspecified: Secondary | ICD-10-CM | POA: Diagnosis not present

## 2017-12-13 DIAGNOSIS — W1839XA Other fall on same level, initial encounter: Secondary | ICD-10-CM | POA: Diagnosis not present

## 2017-12-13 DIAGNOSIS — R9431 Abnormal electrocardiogram [ECG] [EKG]: Secondary | ICD-10-CM | POA: Diagnosis not present

## 2017-12-13 DIAGNOSIS — S199XXA Unspecified injury of neck, initial encounter: Secondary | ICD-10-CM | POA: Diagnosis not present

## 2017-12-13 DIAGNOSIS — Y998 Other external cause status: Secondary | ICD-10-CM | POA: Diagnosis not present

## 2017-12-13 DIAGNOSIS — M25519 Pain in unspecified shoulder: Secondary | ICD-10-CM | POA: Diagnosis not present

## 2017-12-13 DIAGNOSIS — R7989 Other specified abnormal findings of blood chemistry: Secondary | ICD-10-CM | POA: Diagnosis not present

## 2017-12-13 DIAGNOSIS — M25512 Pain in left shoulder: Secondary | ICD-10-CM | POA: Diagnosis not present

## 2017-12-13 DIAGNOSIS — D696 Thrombocytopenia, unspecified: Secondary | ICD-10-CM | POA: Diagnosis not present

## 2017-12-13 DIAGNOSIS — Z66 Do not resuscitate: Secondary | ICD-10-CM | POA: Diagnosis not present

## 2017-12-13 DIAGNOSIS — I491 Atrial premature depolarization: Secondary | ICD-10-CM | POA: Diagnosis not present

## 2017-12-13 DIAGNOSIS — Z6823 Body mass index (BMI) 23.0-23.9, adult: Secondary | ICD-10-CM | POA: Diagnosis not present

## 2017-12-13 DIAGNOSIS — E785 Hyperlipidemia, unspecified: Secondary | ICD-10-CM | POA: Diagnosis not present

## 2017-12-13 DIAGNOSIS — R404 Transient alteration of awareness: Secondary | ICD-10-CM | POA: Diagnosis not present

## 2017-12-13 DIAGNOSIS — N183 Chronic kidney disease, stage 3 (moderate): Secondary | ICD-10-CM | POA: Diagnosis not present

## 2017-12-13 DIAGNOSIS — S4991XA Unspecified injury of right shoulder and upper arm, initial encounter: Secondary | ICD-10-CM | POA: Diagnosis not present

## 2017-12-13 DIAGNOSIS — Z9181 History of falling: Secondary | ICD-10-CM | POA: Diagnosis not present

## 2017-12-13 DIAGNOSIS — S299XXA Unspecified injury of thorax, initial encounter: Secondary | ICD-10-CM | POA: Diagnosis not present

## 2017-12-13 DIAGNOSIS — Z87891 Personal history of nicotine dependence: Secondary | ICD-10-CM | POA: Diagnosis not present

## 2017-12-13 DIAGNOSIS — S80211A Abrasion, right knee, initial encounter: Secondary | ICD-10-CM | POA: Diagnosis not present

## 2017-12-13 DIAGNOSIS — R296 Repeated falls: Secondary | ICD-10-CM | POA: Diagnosis not present

## 2017-12-13 DIAGNOSIS — I471 Supraventricular tachycardia: Secondary | ICD-10-CM | POA: Diagnosis not present

## 2017-12-13 DIAGNOSIS — R102 Pelvic and perineal pain: Secondary | ICD-10-CM | POA: Diagnosis not present

## 2017-12-13 DIAGNOSIS — G309 Alzheimer's disease, unspecified: Secondary | ICD-10-CM | POA: Diagnosis not present

## 2017-12-13 DIAGNOSIS — Z952 Presence of prosthetic heart valve: Secondary | ICD-10-CM | POA: Diagnosis not present

## 2017-12-13 DIAGNOSIS — T07XXXA Unspecified multiple injuries, initial encounter: Secondary | ICD-10-CM | POA: Diagnosis not present

## 2017-12-13 DIAGNOSIS — S40012A Contusion of left shoulder, initial encounter: Secondary | ICD-10-CM | POA: Diagnosis not present

## 2017-12-13 DIAGNOSIS — S0081XA Abrasion of other part of head, initial encounter: Secondary | ICD-10-CM | POA: Diagnosis not present

## 2017-12-13 DIAGNOSIS — S40011A Contusion of right shoulder, initial encounter: Secondary | ICD-10-CM | POA: Diagnosis not present

## 2017-12-13 DIAGNOSIS — S0003XA Contusion of scalp, initial encounter: Secondary | ICD-10-CM | POA: Diagnosis not present

## 2017-12-13 DIAGNOSIS — R000 Tachycardia, unspecified: Secondary | ICD-10-CM | POA: Diagnosis not present

## 2017-12-13 DIAGNOSIS — F05 Delirium due to known physiological condition: Secondary | ICD-10-CM | POA: Diagnosis not present

## 2017-12-13 DIAGNOSIS — E039 Hypothyroidism, unspecified: Secondary | ICD-10-CM | POA: Diagnosis not present

## 2017-12-13 DIAGNOSIS — S80212A Abrasion, left knee, initial encounter: Secondary | ICD-10-CM | POA: Diagnosis not present

## 2017-12-13 DIAGNOSIS — I361 Nonrheumatic tricuspid (valve) insufficiency: Secondary | ICD-10-CM | POA: Diagnosis not present

## 2017-12-14 DIAGNOSIS — I491 Atrial premature depolarization: Secondary | ICD-10-CM | POA: Diagnosis not present

## 2017-12-14 DIAGNOSIS — I471 Supraventricular tachycardia: Secondary | ICD-10-CM | POA: Diagnosis not present

## 2017-12-31 DEATH — deceased
